# Patient Record
Sex: Female | Born: 1990 | Smoking: Never smoker
Health system: Southern US, Community
[De-identification: ages and names within clinical notes are randomized; demographics above are authoritative.]

## PROBLEM LIST (undated history)

## (undated) DIAGNOSIS — K219 Gastro-esophageal reflux disease without esophagitis: Secondary | ICD-10-CM

## (undated) DIAGNOSIS — M545 Low back pain, unspecified: Secondary | ICD-10-CM

## (undated) DIAGNOSIS — G43909 Migraine, unspecified, not intractable, without status migrainosus: Secondary | ICD-10-CM

## (undated) HISTORY — PX: ESOPHAGOGASTRODUODENOSCOPY: SHX1529

---

## 2019-02-26 ENCOUNTER — Ambulatory Visit (INDEPENDENT_AMBULATORY_CARE_PROVIDER_SITE_OTHER): Payer: 59 | Admitting: Adult Health

## 2019-02-26 ENCOUNTER — Encounter: Payer: Self-pay | Admitting: Adult Health

## 2019-02-26 ENCOUNTER — Other Ambulatory Visit: Payer: Self-pay

## 2019-02-26 VITALS — BP 122/82 | HR 107 | Temp 96.8°F | Resp 16 | Ht 62.0 in | Wt 165.2 lb

## 2019-02-26 DIAGNOSIS — Z0000 Encounter for general adult medical examination without abnormal findings: Secondary | ICD-10-CM

## 2019-02-26 DIAGNOSIS — M545 Low back pain, unspecified: Secondary | ICD-10-CM

## 2019-02-26 DIAGNOSIS — M542 Cervicalgia: Secondary | ICD-10-CM | POA: Diagnosis not present

## 2019-02-26 DIAGNOSIS — E559 Vitamin D deficiency, unspecified: Secondary | ICD-10-CM

## 2019-02-26 DIAGNOSIS — S39012A Strain of muscle, fascia and tendon of lower back, initial encounter: Secondary | ICD-10-CM | POA: Diagnosis not present

## 2019-02-26 DIAGNOSIS — Z1329 Encounter for screening for other suspected endocrine disorder: Secondary | ICD-10-CM | POA: Insufficient documentation

## 2019-02-26 DIAGNOSIS — Z13220 Encounter for screening for lipoid disorders: Secondary | ICD-10-CM | POA: Diagnosis not present

## 2019-02-26 DIAGNOSIS — H6123 Impacted cerumen, bilateral: Secondary | ICD-10-CM

## 2019-02-26 LAB — POCT URINALYSIS DIPSTICK
Bilirubin, UA: NEGATIVE
Blood, UA: NEGATIVE
Glucose, UA: NEGATIVE
Ketones, UA: NEGATIVE
Leukocytes, UA: NEGATIVE
Nitrite, UA: NEGATIVE
Protein, UA: POSITIVE — AB
Spec Grav, UA: 1.01 (ref 1.010–1.025)
Urobilinogen, UA: 0.2 E.U./dL
pH, UA: 7.5 (ref 5.0–8.0)

## 2019-02-26 MED ORDER — PREDNISONE 10 MG (21) PO TBPK: ORAL_TABLET | ORAL | 0 refills | Status: DC

## 2019-02-26 MED ORDER — CYCLOBENZAPRINE HCL 10 MG PO TABS: 10.0000 mg | ORAL_TABLET | Freq: Every day | ORAL | 0 refills | Status: DC

## 2019-02-26 NOTE — Progress Notes (Signed)
Patient: Betty Brennan, Female    DOB: [birth year], 29 y.o.   MRN: [id number] Visit Date: 02/26/2019  Today's Provider: Marcille Buffy, FNP   Chief Complaint  Patient presents with  . New Patient (Initial Visit)   Subjective:    New Patient Betty Brennan is a 29 y.o. female who presents today for health maintenance and establish patient care, patient is a former patient of Therapist, nutritional. She feels fairly well, patient reports neck on left side for two weeks and states that she has had lumbar back pain that radiates to her glute for 7 months patient. States that she has tried tylenol in the evening with no relief . She reports she is starting to become active by walking. She reports she is sleeping fairly well.   She had a normal PAP at [facility] at age 42 none since then- was normal.   Neck pain on left side of neck, she thought was dental pain, she reports teeth were fine. Onset was the beginning of January. It went away and returned. She sits at at a desk and types a lot. She notices it is worse at the end of the day. She tried ibuprofen occasionally.  She reports it went away and returned a couple of days. Denies any throat pain or difficulty swallowing. She had a globus feeling occasionally during the time she had neck pain.   She has lower back tenderness, mostly on the left. She work sitting and typing. She has been having pressure in that area. She has been trying to sit better at work.  Reports that she has no saddle paresthesias, no paresthesias or radiculopathies, she denies any loss of bowel or bladder control.  Onset of her back pain was 7 months ago, no injury or trauma she reports.  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  She did have pain radiating into her left gluteal, however this has resolved she reports.  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   Denies any recent  injuries, or trauma. Denies any previous surgeries.   She has more stress at work recently.  Denies any urinary symptoms   Patient  denies any fever, body aches,chills, rash, chest pain, shortness of breath, nausea, vomiting, or diarrhea.  Marland Kitchen  -----------------------------------------------------------------   Review of Systems  Constitutional: Negative.   HENT: Negative.   Respiratory: Negative.   Cardiovascular: Negative.   Gastrointestinal: Negative.   Genitourinary: Negative.   Musculoskeletal: Positive for back pain and neck pain.  Skin: Negative.   Neurological: Negative.   Hematological: Negative.   Psychiatric/Behavioral: Positive for sleep disturbance. Negative for agitation, behavioral problems, confusion, decreased concentration, dysphoric mood, hallucinations, self-injury and suicidal ideas. The patient is nervous/anxious. The patient is not hyperactive.        She goes to bed late.   All other systems reviewed and are negative.   Social History She  reports that she has never smoked. She has never used smokeless tobacco. She reports that she does not drink alcohol or use drugs. Social History   Socioeconomic History  . Marital status: Unknown    Spouse name: Not on file  . Number of children: Not on file  . Years of education: Not on file  . Highest education level: Not on file  Occupational History  . Not on  file  Tobacco Use  . Smoking status: Never Smoker  . Smokeless tobacco: Never Used  Substance and Sexual Activity  . Alcohol use: Never  . Drug use: Never  . Sexual activity: Not Currently  Other Topics Concern  . Not on file  Social History Narrative  . Not on file   Social Determinants of Health   Financial Resource Strain:   . Difficulty of Paying Living Expenses: Not on file  Food Insecurity:   . Worried About Programme researcher, broadcasting/film/video in the Last Year: Not on file  . Ran Out of Food in the Last Year: Not on file    Transportation Needs:   . Lack of Transportation (Medical): Not on file  . Lack of Transportation (Non-Medical): Not on file  Physical Activity:   . Days of Exercise per Week: Not on file  . Minutes of Exercise per Session: Not on file  Stress:   . Feeling of Stress : Not on file  Social Connections:   . Frequency of Communication with Friends and Family: Not on file  . Frequency of Social Gatherings with Friends and Family: Not on file  . Attends Religious Services: Not on file  . Active Member of Clubs or Organizations: Not on file  . Attends Banker Meetings: Not on file  . Marital Status: Not on file    There are no problems to display for this patient.   History reviewed. No pertinent surgical history.  Family History  Family Status  Relation Name Status  . MGM  (Not Specified)  . MGF  (Not Specified)  . PGM  (Not Specified)  . PGF  (Not Specified)   Her family history includes Alzheimer's disease in her maternal grandmother; Congestive Heart Failure in her paternal grandfather; Hypertension in her maternal grandmother; Lung cancer in her maternal grandfather; Stomach cancer in her paternal grandmother.     No Known Allergies  Previous Medications   No medications on file    Patient Care Team: Berniece Pap, FNP as PCP - General (Family Medicine)      Objective:   Vitals: BP 122/82   Pulse (!) 107   Temp (!) 96.8 F (36 C) (Oral)   Resp 16   Ht 5\' 2"  (1.575 m)   Wt 165 lb 3.2 oz (74.9 kg)   LMP 02/19/2019 (Exact Date)   SpO2 97%   BMI 30.22 kg/m    Physical Exam Vitals reviewed.  Constitutional:      General: She is not in acute distress.    Appearance: She is well-developed. She is obese. She is not ill-appearing, toxic-appearing or diaphoretic.     Interventions: She is not intubated. HENT:     Head: Normocephalic and atraumatic.     Right Ear: External ear normal. There is impacted cerumen (hard cerumen bilateral ).      Left Ear: External ear normal. There is impacted cerumen.     Nose: Nose normal.     Mouth/Throat:     Mouth: Mucous membranes are moist.     Pharynx: No oropharyngeal exudate.  Eyes:     General: Lids are normal. No scleral icterus.       Right eye: No discharge.        Left eye: No discharge.     Conjunctiva/sclera: Conjunctivae normal.     Right eye: Right conjunctiva is not injected. No exudate or hemorrhage.    Left eye: Left conjunctiva is not injected. No exudate  or hemorrhage.    Pupils: Pupils are equal, round, and reactive to light.  Neck:     Thyroid: No thyroid mass, thyromegaly or thyroid tenderness.     Vascular: Normal carotid pulses. No carotid bruit, hepatojugular reflux or JVD.     Trachea: Trachea and phonation normal. No tracheal tenderness, tracheostomy or tracheal deviation.     Meningeal: Brudzinski's sign and Kernig's sign absent.  Cardiovascular:     Rate and Rhythm: Normal rate and regular rhythm.     Pulses: Normal pulses.          Radial pulses are 2+ on the right side and 2+ on the left side.       Dorsalis pedis pulses are 2+ on the right side and 2+ on the left side.       Posterior tibial pulses are 2+ on the right side and 2+ on the left side.     Heart sounds: Normal heart sounds, S1 normal and S2 normal. Heart sounds not distant. No murmur. No friction rub. No gallop.   Pulmonary:     Effort: Pulmonary effort is normal. No tachypnea, bradypnea, accessory muscle usage or respiratory distress. She is not intubated.     Breath sounds: Normal breath sounds and air entry. No stridor, decreased air movement or transmitted upper airway sounds. No decreased breath sounds, wheezing, rhonchi or rales.  Chest:     Chest wall: No tenderness.  Abdominal:     General: Bowel sounds are normal. There is no distension or abdominal bruit.     Palpations: Abdomen is soft. There is no shifting dullness, fluid wave, hepatomegaly, splenomegaly, mass or pulsatile mass.      Tenderness: There is no abdominal tenderness. There is no right CVA tenderness, left CVA tenderness, guarding or rebound.     Hernia: No hernia is present.  Genitourinary:    Comments: deferred she may decide to go back [facility] or she will let me know if she wants to do here at next visit. She will get records of the last.  Musculoskeletal:        General: No swelling or deformity. Normal range of motion.     Right shoulder: Normal.     Left shoulder: Normal.     Right upper arm: Normal.     Left upper arm: Normal.     Right forearm: Normal.     Left forearm: Normal.     Cervical back: Normal, full passive range of motion without pain, normal range of motion and neck supple. No swelling, edema, deformity, erythema, signs of trauma, lacerations, rigidity, spasms, torticollis, tenderness, bony tenderness or crepitus. No pain with movement, spinous process tenderness or muscular tenderness. Normal range of motion.     Thoracic back: Normal.     Lumbar back: Spasms and tenderness present. No swelling, edema, deformity, signs of trauma, lacerations or bony tenderness. Normal range of motion. Negative right straight leg raise test and negative left straight leg raise test. No scoliosis.       Back:     Comments: Bilateral lumbar sacral strain/muscle tenderness, and spasm.  No visible rash or zoster.  Lymphadenopathy:     Head:     Right side of head: No submental, submandibular, tonsillar, preauricular, posterior auricular or occipital adenopathy.     Left side of head: No submental, submandibular, tonsillar, preauricular, posterior auricular or occipital adenopathy.     Cervical: No cervical adenopathy.     Right cervical: No  superficial, deep or posterior cervical adenopathy.    Left cervical: No superficial, deep or posterior cervical adenopathy.     Upper Body:     Right upper body: No supraclavicular or pectoral adenopathy.     Left upper body: No supraclavicular or pectoral  adenopathy.  Skin:    General: Skin is warm and dry.     Capillary Refill: Capillary refill takes less than 2 seconds.     Coloration: Skin is not jaundiced or pale.     Findings: No abrasion, bruising, burn, ecchymosis, erythema, lesion, petechiae or rash.     Nails: There is no clubbing.  Neurological:     General: No focal deficit present.     Mental Status: She is alert and oriented to person, place, and time. Mental status is at baseline.     GCS: GCS eye subscore is 4. GCS verbal subscore is 5. GCS motor subscore is 6.     Cranial Nerves: Cranial nerves are intact. No cranial nerve deficit or dysarthria.     Sensory: Sensation is intact. No sensory deficit.     Motor: Motor function is intact. No tremor, atrophy, abnormal muscle tone or seizure activity.     Coordination: Coordination is intact. Romberg sign negative. Coordination normal.     Gait: Gait normal.     Deep Tendon Reflexes: Reflexes are normal and symmetric. Reflexes normal. Babinski sign absent on the right side. Babinski sign absent on the left side.     Reflex Scores:      Tricep reflexes are 2+ on the right side and 2+ on the left side.      Bicep reflexes are 2+ on the right side and 2+ on the left side.      Brachioradialis reflexes are 2+ on the right side and 2+ on the left side.      Patellar reflexes are 2+ on the right side and 2+ on the left side.      Achilles reflexes are 2+ on the right side and 2+ on the left side. Psychiatric:        Attention and Perception: Attention and perception normal.        Mood and Affect: Mood and affect normal.        Speech: Speech normal.        Behavior: Behavior normal. Behavior is cooperative.        Thought Content: Thought content normal.        Cognition and Memory: Cognition and memory normal.        Judgment: Judgment normal.      Depression Screen PHQ 2/9 Scores 02/26/2019  PHQ - 2 Score 0  PHQ- 9 Score 0   No flowsheet data found.      Assessment &  Plan:     Routine Health Maintenance and Physical Exam  Exercise Activities and Dietary recommendations Goals   None      There is no immunization history on file for this patient.  Health Maintenance  Topic Date Due  . HIV Screening  03/23/2005  . TETANUS/TDAP  03/23/2009  . PAP-Cervical Cytology Screening  03/24/2011  . PAP SMEAR-Modifier  03/24/2011  . INFLUENZA VACCINE  08/23/2018     Discussed health benefits of physical activity, and encouraged her to engage in regular exercise appropriate for her age and condition.  Yearly dental exams and eye exams recommended.  Healthy lifestyle, with exercise and diet recommended.  Will return for fasting lab work.  Discussed rest ice/alternating with heating pad to neck and back.  1. Neck pain Seems to be muscular in nature, no other times, range of motion is normal, she has relief with taking warm showers, and resting.  Declines x-ray at this time, and provider does not think that is necessary for a neck x-ray unless symptoms change or worsen.  Cannot exclude an otitis externa or media in the left ear as I am unable to visualize the tympanic membrane due to hard cerumen impaction.  Advised patient that she should use Debrox for 2 weeks and then return to the office to have her ears irrigated.  This could also be a differential for her pain.  Denies any symptoms of infection such as fever chills nausea vomiting or any other systemic symptoms.  2. Vitamin D deficiency Screen for vitamin D deficiency. - VITAMIN D 25 Hydroxy (Vit-D Deficiency, Fractures)  3. 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  Do think that an x-ray of her lumbar spine would be  appropriate.  She will go to [facility] walk-in for this the order has been placed.  Will call with results once they are received. - CBC with Differential/Platelet - Comprehensive Metabolic Panel (CMET)  4. Screening for cholesterol level Will screen for cholesterol. - Lipid Panel w/o Chol/HDL Ratio  5. Screening for thyroid disorder For thyroid disease. - TSH  6. Acute bilateral low back pain, unspecified whether sciatica present Discussed red flags of back pain when to seek emergency medical treatment.  Will obtain x-ray ordered at [facility] imaging patient is aware she can walk-in Monday through Friday 8-4 30 for x-rays.  In agreement to go. Prescribed cyclobenzaprine 10 mg p.o. at hour of sleep.  She is advised that she can take this she can try half of tablet at bedtime first if she would like.  If that amount helps and she can continue a half a tablet or she can increase it to the 10 mg if needed at bedtime.  Prescribed a prednisone Dosepak in hopes that it relieve her back pain if due to inflammation, also her neck pain and discomfort.  Is aware not to take ibuprofen while taking prednisone.  She can take Tylenol for pain per bottle as needed - DG Lumbar Spine Complete; Future  7. Encounter for routine adult health examination without abnormal findings Size and diet recommended. - POCT urinalysis dipstick  8. Impacted cerumen of both ears Discussed above under neck pain, she will use Debrox for 2 weeks and return to the office for any ear irrigation for/ear recheck.  Irrigate if needed. 9. Lumbar sacral strain  She has no questions, she is verbalized understanding of red flag symptoms for back pain and neck pain, and when to seek emergency medical care.  She return to the office as directed and she will take prescriptions prescribed below as directed.  Discussed side effects of Flexeril and not to drive or operate heavy machinery or make legal decisions while taking.  She will  also take prednisone Dosepak as instructed and also reviewed side effects with her of this.  Patient verbalized understanding.  Advised patient call the office or  your primary care doctor for an appointment if no improvement within 72 hours or if any symptoms change or worsen at any time  Advised ER or urgent Care if after hours or on weekend. Call 911 for emergency symptoms at any time.Patinet verbalized understanding of all instructions given/reviewed and treatment plan and has no further questions or concerns at this time.    Return in about 2 weeks (around 03/12/2019), or if symptoms worsen or fail to improve, for at any time for any worsening symptoms, Go to Emergency room/ urgent care if worse. --------------------------------------------------------------------  Advised patient call the office or your primary care doctor for an appointment if no improvement within 72 hours or if any symptoms change or worsen at any time  Advised ER or urgent Care if after hours or on weekend. Call 911 for emergency symptoms at any time.Patinet verbalized understanding of all instructions given/reviewed and treatment plan and has no further questions or concerns at this time.     40 minutes of time was spent with patient in the room, and also 10 minutes was spent reviewing chart new patient paperwork follows through

## 2019-02-26 NOTE — Patient Instructions (Signed)
Carbamide Peroxide ear solution What is this medicine? CARBAMIDE PEROXIDE (CAR bah mide per OX ide) is used to soften and help remove ear wax. This medicine may be used for other purposes; ask your health care provider or pharmacist if you have questions. COMMON BRAND NAME(S): Auro Ear, Auro Earache Relief, Debrox, Ear Drops, Ear Wax Removal, Ear Wax Remover, Earwax Treatment, Murine, Thera-Ear What should I tell my health care provider before I take this medicine? They need to know if you have any of these conditions:  dizziness  ear discharge  ear pain, irritation or rash  infection  perforated eardrum (hole in eardrum)  an unusual or allergic reaction to carbamide peroxide, glycerin, hydrogen peroxide, other medicines, foods, dyes, or preservatives  pregnant or trying to get pregnant  breast-feeding How should I use this medicine? This medicine is only for use in the outer ear canal. Follow the directions carefully. Wash hands before and after use. The solution may be warmed by holding the bottle in the hand for 1 to 2 minutes. Lie with the affected ear facing upward. Place the proper number of drops into the ear canal. After the drops are instilled, remain lying with the affected ear upward for 5 minutes to help the drops stay in the ear canal. A cotton ball may be gently inserted at the ear opening for no longer than 5 to 10 minutes to ensure retention. Repeat, if necessary, for the opposite ear. Do not touch the tip of the dropper to the ear, fingertips, or other surface. Do not rinse the dropper after use. Keep container tightly closed. Talk to your pediatrician regarding the use of this medicine in children. While this drug may be used in children as young as 12 years for selected conditions, precautions do apply. Overdosage: If you think you have taken too much of this medicine contact a poison control center or emergency room at once. NOTE: This medicine is only for you. Do not  share this medicine with others. What if I miss a dose? If you miss a dose, use it as soon as you can. If it is almost time for your next dose, use only that dose. Do not use double or extra doses. What may interact with this medicine? Interactions are not expected. Do not use any other ear products without asking your doctor or health care professional. This list may not describe all possible interactions. Give your health care provider a list of all the medicines, herbs, non-prescription drugs, or dietary supplements you use. Also tell them if you smoke, drink alcohol, or use illegal drugs. Some items may interact with your medicine. What should I watch for while using this medicine? This medicine is not for long-term use. Do not use for more than 4 days without checking with your health care professional. Contact your doctor or health care professional if your condition does not start to get better within a few days or if you notice burning, redness, itching or swelling. What side effects may I notice from receiving this medicine? Side effects that you should report to your doctor or health care professional as soon as possible:  allergic reactions like skin rash, itching or hives, swelling of the face, lips, or tongue  burning, itching, and redness  worsening ear pain  rash Side effects that usually do not require medical attention (report to your doctor or health care professional if they continue or are bothersome):  abnormal sensation while putting the drops in the ear  temporary reduction in hearing (but not complete loss of hearing) This list may not describe all possible side effects. Call your doctor for medical advice about side effects. You may report side effects to FDA at 1-800-FDA-1088. Where should I keep my medicine? Keep out of the reach of children. Store at room temperature between 15 and 30 degrees C (59 and 86 degrees F) in a tight, light-resistant container. Keep  bottle away from excessive heat and direct sunlight. Throw away any unused medicine after the expiration date. NOTE: This sheet is a summary. It may not cover all possible information. If you have questions about this medicine, talk to your doctor, pharmacist, or health care provider.  2020 Elsevier/Gold Standard (2007-04-22 14:00:02) Musculoskeletal Pain Musculoskeletal pain refers to aches and pains in your bones, joints, muscles, and the tissues that surround them. This pain can occur in any part of the body. It can last for a short time (acute) or a long time (chronic). A physical exam, lab tests, and imaging studies may be done to find the cause of your musculoskeletal pain. Follow these instructions at home:  Lifestyle  Try to control or lower your stress levels. Stress increases muscle tension and can worsen musculoskeletal pain. It is important to recognize when you are anxious or stressed and learn ways to manage it. This may include: ? Meditation or yoga. ? Cognitive or behavioral therapy. ? Acupuncture or massage therapy.  You may continue all activities unless the activities cause more pain. When the pain gets better, slowly resume your normal activities. Gradually increase the intensity and duration of your activities or exercise. Managing pain, stiffness, and swelling  Take over-the-counter and prescription medicines only as told by your health care provider.  When your pain is severe, bed rest may be helpful. Lie or sit in any position that is comfortable, but get out of bed and walk around at least every couple of hours.  If directed, apply heat to the affected area as often as told by your health care provider. Use the heat source that your health care provider recommends, such as a moist heat pack or a heating pad. ? Place a towel between your skin and the heat source. ? Leave the heat on for 20-30 minutes. ? Remove the heat if your skin turns bright red. This is  especially important if you are unable to feel pain, heat, or cold. You may have a greater risk of getting burned.  If directed, put ice on the painful area. ? Put ice in a plastic bag. ? Place a towel between your skin and the bag. ? Leave the ice on for 20 minutes, 2-3 times a day. General instructions  Your health care provider may recommend that you see a physical therapist. This person can help you come up with a safe exercise program. Do any exercises as told by your physical therapist.  Keep all follow-up visits, including any physical therapy visits, as told by your health care providers. This is important. Contact a health care provider if:  Your pain gets worse.  Medicines do not help ease your pain.  You cannot use the part of your body that hurts, such as your arm, leg, or neck.  You have trouble sleeping.  You have trouble doing your normal activities. Get help right away if:  You have a new injury and your pain is worse or different.  You feel numb or you have tingling in the painful area. Summary  Musculoskeletal pain  refers to aches and pains in your bones, joints, muscles, and the tissues that surround them.  This pain can occur in any part of the body.  Your health care provider may recommend that you see a physical therapist. This person can help you come up with a safe exercise program. Do any exercises as told by your physical therapist.  Lower your stress level. Stress can worsen musculoskeletal pain. Ways to lower stress may include meditation, yoga, cognitive or behavioral therapy, acupuncture, and massage therapy. This information is not intended to replace advice given to you by your health care provider. Make sure you discuss any questions you have with your health care provider. Document Revised: 12/21/2016 Document Reviewed: 02/08/2016 Elsevier Patient Education  2020 Elsevier Inc. Acute Back Pain, Adult Acute back pain is sudden and usually  short-lived. It is often caused by an injury to the muscles and tissues in the back. The injury may result from:  A muscle or ligament getting overstretched or torn (strained). Ligaments are tissues that connect bones to each other. Lifting something improperly can cause a back strain.  Wear and tear (degeneration) of the spinal disks. Spinal disks are circular tissue that provides cushioning between the bones of the spine (vertebrae).  Twisting motions, such as while playing sports or doing yard work.  A hit to the back.  Arthritis. You may have a physical exam, lab tests, and imaging tests to find the cause of your pain. Acute back pain usually goes away with rest and home care. Follow these instructions at home: Managing pain, stiffness, and swelling  Take over-the-counter and prescription medicines only as told by your health care provider.  Your health care provider may recommend applying ice during the first 24-48 hours after your pain starts. To do this: ? Put ice in a plastic bag. ? Place a towel between your skin and the bag. ? Leave the ice on for 20 minutes, 2-3 times a day.  If directed, apply heat to the affected area as often as told by your health care provider. Use the heat source that your health care provider recommends, such as a moist heat pack or a heating pad. ? Place a towel between your skin and the heat source. ? Leave the heat on for 20-30 minutes. ? Remove the heat if your skin turns bright red. This is especially important if you are unable to feel pain, heat, or cold. You have a greater risk of getting burned. Activity   Do not stay in bed. Staying in bed for more than 1-2 days can delay your recovery.  Sit up and stand up straight. Avoid leaning forward when you sit, or hunching over when you stand. ? If you work at a desk, sit close to it so you do not need to lean over. Keep your chin tucked in. Keep your neck drawn back, and keep your elbows bent at a  right angle. Your arms should look like the letter "L." ? Sit high and close to the steering wheel when you drive. Add lower back (lumbar) support to your car seat, if needed.  Take short walks on even surfaces as soon as you are able. Try to increase the length of time you walk each day.  Do not sit, drive, or stand in one place for more than 30 minutes at a time. Sitting or standing for long periods of time can put stress on your back.  Do not drive or use heavy machinery while taking  prescription pain medicine.  Use proper lifting techniques. When you bend and lift, use positions that put less stress on your back: ? [place] your knees. ? Keep the load close to your body. ? Avoid twisting.  Exercise regularly as told by your health care provider. Exercising helps your back heal faster and helps prevent back injuries by keeping muscles strong and flexible.  Work with a physical therapist to make a safe exercise program, as recommended by your health care provider. Do any exercises as told by your physical therapist. Lifestyle  Maintain a healthy weight. Extra weight puts stress on your back and makes it difficult to have good posture.  Avoid activities or situations that make you feel anxious or stressed. Stress and anxiety increase muscle tension and can make back pain worse. Learn ways to manage anxiety and stress, such as through exercise. General instructions  Sleep on a firm mattress in a comfortable position. Try lying on your side with your knees slightly bent. If you lie on your back, put a pillow under your knees.  Follow your treatment plan as told by your health care provider. This may include: ? Cognitive or behavioral therapy. ? Acupuncture or massage therapy. ? Meditation or yoga. Contact a health care provider if:  You have pain that is not relieved with rest or medicine.  You have increasing pain going down into your legs or buttocks.  Your pain does not improve  after 2 weeks.  You have pain at night.  You lose weight without trying.  You have a fever or chills. Get help right away if:  You develop new bowel or bladder control problems.  You have unusual weakness or numbness in your arms or legs.  You develop nausea or vomiting.  You develop abdominal pain.  You feel faint. Summary  Acute back pain is sudden and usually short-lived.  Use proper lifting techniques. When you bend and lift, use positions that put less stress on your back.  Take over-the-counter and prescription medicines and apply heat or ice as directed by your health care provider. This information is not intended to replace advice given to you by your health care provider. Make sure you discuss any questions you have with your health care provider. Document Revised: 04/29/2018 Document Reviewed: 08/22/2016 Elsevier Patient Education  2020 [employer]. Health Maintenance, Female Adopting a healthy lifestyle and getting preventive care are important in promoting health and wellness. Ask your health care provider about:  The right schedule for you to have regular tests and exams.  Things you can do on your own to prevent diseases and keep yourself healthy. What should I know about diet, weight, and exercise? Eat a healthy diet   Eat a diet that includes plenty of vegetables, fruits, low-fat dairy products, and lean protein.  Do not eat a lot of foods that are high in solid fats, added sugars, or sodium. Maintain a healthy weight Body mass index (BMI) is used to identify weight problems. It estimates body fat based on height and weight. Your health care provider can help determine your BMI and help you achieve or maintain a healthy weight. Get regular exercise Get regular exercise. This is one of the most important things you can do for your health. Most adults should:  Exercise for at least 150 minutes each week. The exercise should increase your heart rate and  make you sweat (moderate-intensity exercise).  Do strengthening exercises at least twice a week. This is in addition to  the moderate-intensity exercise.  Spend less time sitting. Even light physical activity can be beneficial. Watch cholesterol and blood lipids Have your blood tested for lipids and cholesterol at 29 years of age, then have this test every 5 years. Have your cholesterol levels checked more often if:  Your lipid or cholesterol levels are high.  You are older than 29 years of age.  You are at high risk for heart disease. What should I know about cancer screening? Depending on your health history and family history, you may need to have cancer screening at various ages. This may include screening for:  Breast cancer.  Cervical cancer.  Colorectal cancer.  Skin cancer.  Lung cancer. What should I know about heart disease, diabetes, and high blood pressure? Blood pressure and heart disease  High blood pressure causes heart disease and increases the risk of stroke. This is more likely to develop in people who have high blood pressure readings, are of African descent, or are overweight.  Have your blood pressure checked: ? Every 3-5 years if you are 76-55 years of age. ? Every year if you are 48 years old or older. Diabetes Have regular diabetes screenings. This checks your fasting blood sugar level. Have the screening done:  Once every three years after age 63 if you are at a normal weight and have a low risk for diabetes.  More often and at a younger age if you are overweight or have a high risk for diabetes. What should I know about preventing infection? Hepatitis B If you have a higher risk for hepatitis B, you should be screened for this virus. Talk with your health care provider to find out if you are at risk for hepatitis B infection. Hepatitis C Testing is recommended for:  Everyone born from [birth year] through 1965.  Anyone with known risk factors for  hepatitis C. Sexually transmitted infections (STIs)  Get screened for STIs, including gonorrhea and chlamydia, if: ? You are sexually active and are younger than 29 years of age. ? You are older than 29 years of age and your health care provider tells you that you are at risk for this type of infection. ? Your sexual activity has changed since you were last screened, and you are at increased risk for chlamydia or gonorrhea. Ask your health care provider if you are at risk.  Ask your health care provider about whether you are at high risk for HIV. Your health care provider may recommend a prescription medicine to help prevent HIV infection. If you choose to take medicine to prevent HIV, you should first get tested for HIV. You should then be tested every 3 months for as long as you are taking the medicine. Pregnancy  If you are about to stop having your period (premenopausal) and you may become pregnant, seek counseling before you get pregnant.  Take 400 to 800 micrograms (mcg) of folic acid every day if you become pregnant.  Ask for birth control (contraception) if you want to prevent pregnancy. Osteoporosis and menopause Osteoporosis is a disease in which the bones lose minerals and strength with aging. This can result in bone fractures. If you are 58 years old or older, or if you are at risk for osteoporosis and fractures, ask your health care provider if you should:  Be screened for bone loss.  Take a calcium or vitamin D supplement to lower your risk of fractures.  Be given hormone replacement therapy (HRT) to treat symptoms of menopause. Follow  these instructions at home: Lifestyle  Do not use any products that contain nicotine or tobacco, such as cigarettes, e-cigarettes, and chewing tobacco. If you need help quitting, ask your health care provider.  Do not use street drugs.  Do not share needles.  Ask your health care provider for help if you need support or information about  quitting drugs. Alcohol use  Do not drink alcohol if: ? Your health care provider tells you not to drink. ? You are pregnant, may be pregnant, or are planning to become pregnant.  If you drink alcohol: ? Limit how much you use to 0-1 drink a day. ? Limit intake if you are breastfeeding.  Be aware of how much alcohol is in your drink. In the [place] one drink equals one 12 oz bottle of beer (355 mL), one 5 oz glass of wine (148 mL), or one 1 oz glass of hard liquor (44 mL). General instructions  Schedule regular health, dental, and eye exams.  Stay current with your vaccines.  Tell your health care provider if: ? You often feel depressed. ? You have ever been abused or do not feel safe at home. Summary  Adopting a healthy lifestyle and getting preventive care are important in promoting health and wellness.  Follow your health care provider's instructions about healthy diet, exercising, and getting tested or screened for diseases.  Follow your health care provider's instructions on monitoring your cholesterol and blood pressure. This information is not intended to replace advice given to you by your health care provider. Make sure you discuss any questions you have with your health care provider. Document Revised: 01/01/2018 Document Reviewed: 01/01/2018 Elsevier Patient Education  2020 [employer].

## 2019-02-28 LAB — CBC WITH DIFFERENTIAL/PLATELET
Basophils Absolute: 0 10*3/uL (ref 0.0–0.2)
Basos: 0 %
EOS (ABSOLUTE): 0.1 10*3/uL (ref 0.0–0.4)
Eos: 1 %
Hematocrit: 42.5 % (ref 34.0–46.6)
Hemoglobin: 14.1 g/dL (ref 11.1–15.9)
Immature Grans (Abs): 0 10*3/uL (ref 0.0–0.1)
Immature Granulocytes: 0 %
Lymphocytes Absolute: 2.3 10*3/uL (ref 0.7–3.1)
Lymphs: 33 %
MCH: 28.6 pg (ref 26.6–33.0)
MCHC: 33.2 g/dL (ref 31.5–35.7)
MCV: 86 fL (ref 79–97)
Monocytes Absolute: 0.7 10*3/uL (ref 0.1–0.9)
Monocytes: 10 %
Neutrophils Absolute: 3.8 10*3/uL (ref 1.4–7.0)
Neutrophils: 56 %
Platelets: 368 10*3/uL (ref 150–450)
RBC: 4.93 x10E6/uL (ref 3.77–5.28)
RDW: 13.1 % (ref 11.7–15.4)
WBC: 6.9 10*3/uL (ref 3.4–10.8)

## 2019-02-28 LAB — COMPREHENSIVE METABOLIC PANEL
ALT: 17 IU/L (ref 0–32)
AST: 21 IU/L (ref 0–40)
Albumin/Globulin Ratio: 1.7 (ref 1.2–2.2)
Albumin: 4.7 g/dL (ref 3.9–5.0)
Alkaline Phosphatase: 84 IU/L (ref 39–117)
BUN/Creatinine Ratio: 13 (ref 9–23)
BUN: 9 mg/dL (ref 6–20)
Bilirubin Total: 0.3 mg/dL (ref 0.0–1.2)
CO2: 23 mmol/L (ref 20–29)
Calcium: 9.4 mg/dL (ref 8.7–10.2)
Chloride: 102 mmol/L (ref 96–106)
Creatinine, Ser: 0.7 mg/dL (ref 0.57–1.00)
GFR calc Af Amer: 136 mL/min/{1.73_m2} (ref >59)
GFR calc non Af Amer: 118 mL/min/{1.73_m2} (ref >59)
Globulin, Total: 2.7 g/dL (ref 1.5–4.5)
Glucose: 88 mg/dL (ref 65–99)
Potassium: 4.4 mmol/L (ref 3.5–5.2)
Sodium: 140 mmol/L (ref 134–144)
Total Protein: 7.4 g/dL (ref 6.0–8.5)

## 2019-02-28 LAB — TSH: TSH: 1.69 u[IU]/mL (ref 0.450–4.500)

## 2019-02-28 LAB — LIPID PANEL W/O CHOL/HDL RATIO
Cholesterol, Total: 201 mg/dL — ABNORMAL HIGH (ref 100–199)
HDL: 53 mg/dL (ref >39)
LDL Chol Calc (NIH): 130 mg/dL — ABNORMAL HIGH (ref 0–99)
Triglycerides: 98 mg/dL (ref 0–149)
VLDL Cholesterol Cal: 18 mg/dL (ref 5–40)

## 2019-02-28 LAB — VITAMIN D 25 HYDROXY (VIT D DEFICIENCY, FRACTURES): Vit D, 25-Hydroxy: 25.9 ng/mL — ABNORMAL LOW (ref 30.0–100.0)

## 2019-03-02 ENCOUNTER — Ambulatory Visit
Admission: RE | Admit: 2019-03-02 | Discharge: 2019-03-02 | Disposition: A | Discharge status: Home / Self Care | Payer: 59 | Source: Ambulatory Visit | Attending: Adult Health | Admitting: Adult Health

## 2019-03-02 ENCOUNTER — Other Ambulatory Visit: Payer: Self-pay

## 2019-03-02 DIAGNOSIS — M545 Low back pain, unspecified: Secondary | ICD-10-CM

## 2019-03-02 NOTE — Progress Notes (Signed)
CBC normal no anemia or signs of infection.  CMP normal kidney and liver function.  Thyroid lab within normal limits.  Total cholesterol and LDL elevated.  Discuss lifestyle modification with patient e.g. increase exercise, fiber, fruits, vegetables, lean meat, and omega 3/fish intake and decrease saturated fat.  If patient following strict diet and exercise program already please schedule follow up appointment with primary care physician.  Recheck cholesterol in 6 months. Keep plan from last office visit.

## 2019-03-03 ENCOUNTER — Telehealth: Payer: Self-pay

## 2019-03-03 DIAGNOSIS — M545 Low back pain, unspecified: Secondary | ICD-10-CM

## 2019-03-03 DIAGNOSIS — M542 Cervicalgia: Secondary | ICD-10-CM

## 2019-03-03 DIAGNOSIS — R937 Abnormal findings on diagnostic imaging of other parts of musculoskeletal system: Secondary | ICD-10-CM

## 2019-03-03 NOTE — Telephone Encounter (Signed)
Pt given result per Marvell Fuller; she verbalized understanding; the pt would like to be seen in [facility] area; will route to office for notification.

## 2019-03-03 NOTE — Progress Notes (Signed)
Mild scoliosis of lumbar spine that may be due to position per radiologist. She also has mild joint slipping at L2-L3. Mild to moderate disc narrowing at L5-S1. 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 Let me know if she has a preference on area/ practice/ doctor to refer to.   Also she can walk in at [facility] Monday to Friday 1pm-7pm if any symptoms worsening.  Just review Red Flags for back pain including any of loss of bowel or bladder control, numbness in the groin would need to be seen in ED immediately.

## 2019-03-03 NOTE — Telephone Encounter (Signed)
-----   Message from Berniece Pap, FNP sent at 03/03/2019  9:04 AM EST ----- Mild scoliosis of lumbar spine that may be due to position per radiologist. She also has mild joint slipping at L2-L3. Mild to moderate disc narrowing at L5-S1. 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 Let me know if she has a preference on area/ practice/ doctor to refer to.   Also she can walk in at [facility] Monday to Friday 1pm-7pm if any symptoms worsening.  Just review Red Flags for back pain including any of loss of bowel or bladder control, numbness in the groin would need to be seen in ED immediately.

## 2019-03-03 NOTE — Telephone Encounter (Signed)
LMTCB, PEC may give results. 

## 2019-03-03 NOTE — Telephone Encounter (Signed)
Please advise 

## 2019-03-04 DIAGNOSIS — R937 Abnormal findings on diagnostic imaging of other parts of musculoskeletal system: Secondary | ICD-10-CM | POA: Insufficient documentation

## 2019-03-04 NOTE — Telephone Encounter (Signed)
Orders Placed This Encounter  Procedures  . Ambulatory referral to Orthopedic Surgery   

## 2019-03-04 NOTE — Addendum Note (Signed)
Addended by: Berniece Pap on: 03/04/2019 09:07 AM   Modules accepted: Orders

## 2019-03-04 NOTE — Telephone Encounter (Signed)
Referral placed to [facility] [place].  She should hear within 1-2 weeks if not give office a call.  Advise patient call the office or your primary care doctor for an appointment if no improvement within 72 hours or if any symptoms change or worsen at any time  Advised ER or urgent Care if after hours or on weekend. Call 911 for emergency symptoms at any time.Patinet verbalized understanding of all instructions given/reviewed and treatment plan and has no further questions or concerns at this time.

## 2019-03-05 ENCOUNTER — Telehealth: Payer: Self-pay | Admitting: Adult Health

## 2019-03-05 NOTE — Telephone Encounter (Signed)
Pt came in for back problems last week / Pt called and is now having tingles in her legs/ Pt stated the Prednisone helped at first but now that she is almost finished it she is having the tingling sensation / please advise /Pt would like to know what she needs to do / she was advised to call back if symptoms worsen

## 2019-03-05 NOTE — Telephone Encounter (Signed)
She can return for an evaluation in office if any symptoms have changed since I seen her. If any groin numbness, loss of bowel/ bladder control Emergency room. I can order her prednisone dose pack once more. She should be hearing from referral to [facility] within the week.- due to abnormal x ray as has been relayed to patient. The other option is she can go ahead and go to [facility] walk in clinic this afternoon open Monday to Friday 1pm to 7pm.

## 2019-03-05 NOTE — Telephone Encounter (Signed)
Patient has been advised. KW 

## 2019-03-05 NOTE — Telephone Encounter (Signed)
Patient was advised and states that she will follow with [facility]. KW

## 2019-03-09 ENCOUNTER — Encounter: Payer: Self-pay | Admitting: Adult Health

## 2019-03-09 ENCOUNTER — Other Ambulatory Visit: Payer: Self-pay

## 2019-03-09 ENCOUNTER — Ambulatory Visit (INDEPENDENT_AMBULATORY_CARE_PROVIDER_SITE_OTHER): Payer: 59 | Admitting: Adult Health

## 2019-03-09 VITALS — BP 112/84 | HR 88 | Temp 97.8°F | Resp 16 | Wt 165.6 lb

## 2019-03-09 DIAGNOSIS — H6982 Other specified disorders of Eustachian tube, left ear: Secondary | ICD-10-CM

## 2019-03-09 DIAGNOSIS — H6502 Acute serous otitis media, left ear: Secondary | ICD-10-CM | POA: Diagnosis not present

## 2019-03-09 DIAGNOSIS — L989 Disorder of the skin and subcutaneous tissue, unspecified: Secondary | ICD-10-CM

## 2019-03-09 MED ORDER — FLUTICASONE PROPIONATE 50 MCG/ACT NA SUSP: 2.0000 | Freq: Every day | NASAL | 1 refills | Status: DC

## 2019-03-09 MED ORDER — AMOXICILLIN 875 MG PO TABS: 875.0000 mg | ORAL_TABLET | Freq: Two times a day (BID) | ORAL | 0 refills | Status: DC

## 2019-03-09 NOTE — Progress Notes (Signed)
Patient: Betty Brennan Female    DOB: [birth year]   28 y.o.   MRN: [id number] Visit Date: 03/09/2019  Today's Provider: Jairo Ben, FNP   Chief Complaint  Patient presents with  . Cerumen Impaction   Subjective:     Ear Fullness  There is pain in both ears. This is a recurrent problem. The current episode started 1 to 4 weeks ago. The problem has been unchanged. There has been no fever. Associated symptoms include ear discharge, hearing loss and neck pain. Pertinent negatives include no abdominal pain, coughing, diarrhea, headaches, rash, rhinorrhea, sore throat or vomiting. She has tried ear drops for the symptoms. The treatment provided no relief.   She saw [facility] - last week. He gave Meloxicam 15mg  . She is doing iced and heat. She has physical therapy ordered by orthopedics.   Lumbar x ray is on file. She has no new concerns, denies any groin numbness or any loss of bowel or bladder control. She will keep the follow up's with [facility] and report new or changing concerns.   She is due for PAP smear, declines today. She will schedule.   Patient's last menstrual period was 02/19/2019 (exact date).    No Known Allergies   Current Outpatient Medications:  .  meloxicam (MOBIC) 15 MG tablet, Take 15 mg by mouth daily., Disp: , Rfl:   Review of Systems  Constitutional: Negative.   HENT: Positive for ear discharge and hearing loss. Negative for congestion, dental problem, drooling, ear pain, facial swelling, mouth sores, nosebleeds, postnasal drip, rhinorrhea, sinus pressure, sinus pain, sneezing, sore throat, tinnitus, trouble swallowing and voice change.   Respiratory: Negative.  Negative for cough.   Cardiovascular: Negative.   Gastrointestinal: Negative.  Negative for abdominal pain, diarrhea and vomiting.  Genitourinary: Negative.   Musculoskeletal: Positive for arthralgias, back pain and neck pain. Negative for gait problem, joint  swelling, myalgias and neck stiffness.  Skin: Negative for rash.       Enlarging skin lesion on chin - reports present x 1 year.   Neurological: Negative.  Negative for headaches.  Psychiatric/Behavioral: Negative.     Social History   Tobacco Use  . Smoking status: Never Smoker  . Smokeless tobacco: Never Used  Substance Use Topics  . Alcohol use: Never      Objective:  Blood pressure 112/84, pulse 88, temperature 97.8 F (36.6 C), temperature source Temporal, resp. rate 16, weight 165 lb 9.6 oz (75.1 kg), last menstrual period 02/19/2019, SpO2 97 %.   Physical Exam Vitals reviewed.  Constitutional:      General: She is not in acute distress.    Appearance: Normal appearance. She is well-developed. She is not ill-appearing, toxic-appearing or diaphoretic.  HENT:     Head: Normocephalic and atraumatic.     Jaw: There is normal jaw occlusion.     Salivary Glands: Right salivary gland is not diffusely enlarged or tender. Left salivary gland is not diffusely enlarged or tender.     Right Ear: Hearing, ear canal and external ear normal. A middle ear effusion is present. Tympanic membrane is not perforated, erythematous or bulging.     Left Ear: Hearing, ear canal and external ear normal. A middle ear effusion is present. Tympanic membrane is erythematous (brown/ yellow fluid behind tympanic membrane left/ erythematous membrane  - mild bulging. ) and bulging. Tympanic membrane is not perforated.     Nose: Mucosal edema present. No congestion or rhinorrhea.  Right Sinus: Maxillary sinus tenderness present. No frontal sinus tenderness.     Left Sinus: No maxillary sinus tenderness or frontal sinus tenderness.     Mouth/Throat:     Mouth: Mucous membranes are moist.     Pharynx: Uvula midline. No oropharyngeal exudate or posterior oropharyngeal erythema.     Tonsils: No tonsillar exudate or tonsillar abscesses. 2+ on the right. 0 on the left.  Eyes:     General: Lids are normal.  No scleral icterus.       Right eye: No discharge.        Left eye: No discharge.     Conjunctiva/sclera: Conjunctivae normal.     Pupils: Pupils are equal, round, and reactive to light.  Neck:     Vascular: No carotid bruit or JVD.     Trachea: Trachea normal. No tracheal deviation.     Meningeal: Brudzinski's sign absent.  Cardiovascular:     Rate and Rhythm: Normal rate and regular rhythm.     Heart sounds: Normal heart sounds. No murmur. No friction rub. No gallop.   Pulmonary:     Effort: Pulmonary effort is normal. No respiratory distress.     Breath sounds: Normal breath sounds. No stridor. No wheezing or rales.  Chest:     Chest wall: No tenderness.  Abdominal:     General: Bowel sounds are normal.     Palpations: Abdomen is soft.  Musculoskeletal:        General: Normal range of motion.     Cervical back: Full passive range of motion without pain, normal range of motion and neck supple. No rigidity or tenderness.  Lymphadenopathy:     Head:     Right side of head: No submental, submandibular, tonsillar, preauricular, posterior auricular or occipital adenopathy.     Left side of head: No submental, submandibular, tonsillar, preauricular, posterior auricular or occipital adenopathy.     Cervical: Cervical adenopathy present.  Skin:    General: Skin is warm and dry.     Capillary Refill: Capillary refill takes less than 2 seconds.     Coloration: Skin is not pale.     Findings: Lesion present. No erythema or rash. Rash is not crusting or vesicular.          Comments: Tiny rough skin colored lesion with appearance of  wart like structure on chin, no drainage or blister. Question Molluscum contagiosum versus wart,   Neurological:     Mental Status: She is alert and oriented to person, place, and time.  Psychiatric:        Speech: Speech normal.        Behavior: Behavior normal.        Thought Content: Thought content normal.        Judgment: Judgment normal.      No  results found for any visits on 03/09/19.     Assessment & Plan    1. Non-recurrent acute serous otitis media of left ear Meds ordered this encounter  Medications  . amoxicillin (AMOXIL) 875 MG tablet    Sig: Take 1 tablet (875 mg total) by mouth 2 (two) times daily.    Dispense:  20 tablet    Refill:  0  . fluticasone (FLONASE) 50 MCG/ACT nasal spray    Sig: Place 2 sprays into both nostrils daily.    Dispense:  16 g    Refill:  1     2. Lesion of skin of face  - Ambulatory  referral to Dermatology  3. Eustachian tube dysfunction, left Flonase as directed above     Return in about 2 weeks (around 03/23/2019), or if symptoms worsen or fail to improve, for at any time for any worsening symptoms, Go to Emergency room/ urgent care if worse.  Advised patient call the office or your primary care doctor for an appointment if no improvement within 72 hours or if any symptoms change or worsen at any time  Advised ER or urgent Care if after hours or on weekend. Call 911 for emergency symptoms at any time.Patinet verbalized understanding of all instructions given/reviewed and treatment plan and has no further questions or concerns at this time.      The entirety of the information documented in the History of Present Illness, Review of Systems and Physical Exam were personally obtained by me. Portions of this information were initially documented by the  Certified Medical Assistant whose name is documented in Epic and reviewed by me for thoroughness and accuracy.  I have personally performed the exam and reviewed the chart and it is accurate to the best of my knowledge.  Museum/gallery conservator has been used and any errors in dictation or transcription are unintentional.  Eula Fried. Flinchum FNP-C  [facility]   Jairo Ben, FNP  [facility]

## 2019-03-09 NOTE — Patient Instructions (Addendum)
Follow up for your due PAP smear- pelvic exam at your convenience, if you prefer gynecologist we can refer you for that as well. Just let us know.   Otitis Media, Adult  Otitis media means that the middle ear is red and swollen (inflamed) and full of fluid. The condition usually goes away on its own. Follow these instructions at home:  Take over-the-counter and prescription medicines only as told by your doctor.  If you were prescribed an antibiotic medicine, take it as told by your doctor. Do not stop taking the antibiotic even if you start to feel better.  Keep all follow-up visits as told by your doctor. This is important. Contact a doctor if:  You have bleeding from your nose.  There is a lump on your neck.  You are not getting better in 5 days.  You feel worse instead of better. Get help right away if:  You have pain that is not helped with medicine.  You have swelling, redness, or pain around your ear.  You get a stiff neck.  You cannot move part of your face (paralyzed).  You notice that the bone behind your ear hurts when you touch it.  You get a very bad headache. Summary  Otitis media means that the middle ear is red, swollen, and full of fluid.  This condition usually goes away on its own. In some cases, treatment may be needed.  If you were prescribed an antibiotic medicine, take it as told by your doctor. This information is not intended to replace advice given to you by your health care provider. Make sure you discuss any questions you have with your health care provider. Document Revised: 12/21/2016 Document Reviewed: 01/30/2016 Elsevier Patient Education  2020 Else[employer]. Amoxicillin capsules or tablets What is this medicine? AMOXICILLIN (a mox i SIL in) is a penicillin antibiotic. It is used to treat certain kinds of bacterial infections. It will not work for colds, flu, or other viral infections. This medicine may be used for other purposes; ask  your health care provider or pharmacist if you have questions. COMMON BRAND NAME(S): Amoxil, Moxilin, Sumox, Trimox What should I tell my health care provider before I take this medicine? They need to know if you have any of these conditions:  kidney disease  an unusual or allergic reaction to amoxicillin, other penicillins, cephalosporin antibiotics, other medicines, foods, dyes, or preservatives  pregnant or trying to get pregnant  breast-feeding How should I use this medicine? Take this medicine by mouth with a glass of water. Follow the directions on your prescription label. You can take it with or without food. If it upsets your stomach, take it with food. Take your medicine at regular intervals. Do not take your medicine more often than directed. Take all of your medicine as directed even if you think you are better. Do not skip doses or stop your medicine early. Talk to your pediatrician regarding the use of this medicine in children. While this drug may be prescribed for selected conditions, precautions do apply. Overdosage: If you think you have taken too much of this medicine contact a poison control center or emergency room at once. NOTE: This medicine is only for you. Do not share this medicine with others. What if I miss a dose? If you miss a dose, take it as soon as you can. If it is almost time for your next dose, take only that dose. Do not take double or extra doses. What may interact  with this medicine?  allopurinol  birth control pills  certain antibiotics like chloramphenicol, erythromycin, sulfamethoxazole, tetracycline  certain medicines that treat or prevent blood clots like warfarin This list may not describe all possible interactions. Give your health care provider a list of all the medicines, herbs, non-prescription drugs, or dietary supplements you use. Also tell them if you smoke, drink alcohol, or use illegal drugs. Some items may interact with your  medicine. What should I watch for while using this medicine? Tell your health care professional if your symptoms do not start to get better or if they get worse. Do not treat diarrhea with over the counter products. Contact your health care professional if you have diarrhea that lasts more than 2 days or if it is severe and watery. If you have diabetes, you may get a false-positive result for sugar in your urine. Check with your health care professional. Birth control may not work properly while you are taking this medicine. Talk to your health care professional about using an extra method of birth control. This medicine may cause serious skin reactions. They can happen weeks to months after starting the medicine. Contact your health care provider right away if you notice fevers or flu-like symptoms with a rash. The rash may be red or purple and then turn into blisters or peeling of the skin. Or, you might notice a red rash with swelling of the face, lips or lymph nodes in your neck or under your arms. What side effects may I notice from receiving this medicine? Side effects that you should report to your doctor or health care professional as soon as possible:  allergic reactions like skin rash, itching or hives, swelling of the face, lips, or tongue  bloody or watery diarrhea  breathing problems  feeling faint; lightheaded, falls  fever  redness, blistering, peeling or loosening of the skin, including inside the mouth  seizures  signs and symptoms of kidney injury like trouble passing urine or change in the amount of urine  signs and symptoms of liver injury like dark yellow or brown urine; general ill feeling or flu-like symptoms; light-colored stools; loss of appetite; nausea; right upper belly pain; unusually weak or tired; yellowing of the eyes or skin  unusual bleeding or bruising  unusually weak or tired Side effects that usually do not require medical attention (report to your  doctor or health care professional if they continue or are bothersome):  anxious  confusion  diarrhea  dizziness  headache  nausea, vomiting  stomach upset  trouble sleeping This list may not describe all possible side effects. Call your doctor for medical advice about side effects. You may report side effects to FDA at 1-800-FDA-1088. Where should I keep my medicine? Keep out of the reach of children. Store at room temperature between 20 and 25 degrees C (68 and 77 degrees F). Throw away any unused medicine after the expiration date. NOTE: This sheet is a summary. It may not cover all possible information. If you have questions about this medicine, talk to your doctor, pharmacist, or health care provider.  2020 Elsevier/Gold Standard (2018-03-21 14:32:29)  Earwax Buildup, Adult The ears produce a substance called earwax that helps keep bacteria out of the ear and protects the skin in the ear canal. Occasionally, earwax can build up in the ear and cause discomfort or hearing loss. What increases the risk? This condition is more likely to develop in people who:  Are female.  Are elderly.  Naturally  produce more earwax.  Clean their ears often with cotton swabs.  Use earplugs often.  Use in-ear headphones often.  Wear hearing aids.  Have narrow ear canals.  Have earwax that is overly thick or sticky.  Have eczema.  Are dehydrated.  Have excess hair in the ear canal. What are the signs or symptoms? Symptoms of this condition include:  Reduced or muffled hearing.  A feeling of fullness in the ear or feeling that the ear is plugged.  Fluid coming from the ear.  Ear pain.  Ear itch.  Ringing in the ear.  Coughing.  An obvious piece of earwax that can be seen inside the ear canal. How is this diagnosed? This condition may be diagnosed based on:  Your symptoms.  Your medical history.  An ear exam. During the exam, your health care provider will look  into your ear with an instrument called an otoscope. You may have tests, including a hearing test. How is this treated? This condition may be treated by:  Using ear drops to soften the earwax.  Having the earwax removed by a health care provider. The health care provider may: ? Flush the ear with water. ? Use an instrument that has a loop on the end (curette). ? Use a suction device.  Surgery to remove the wax buildup. This may be done in severe cases. Follow these instructions at home:   Take over-the-counter and prescription medicines only as told by your health care provider.  Do not put any objects, including cotton swabs, into your ear. You can clean the opening of your ear canal with a washcloth or facial tissue.  Follow instructions from your health care provider about cleaning your ears. Do not over-clean your ears.  Drink enough fluid to keep your urine clear or pale yellow. This will help to thin the earwax.  Keep all follow-up visits as told by your health care provider. If earwax builds up in your ears often or if you use hearing aids, consider seeing your health care provider for routine, preventive ear cleanings. Ask your health care provider how often you should schedule your cleanings.  If you have hearing aids, clean them according to instructions from the manufacturer and your health care provider. Contact a health care provider if:  You have ear pain.  You develop a fever.  You have blood, pus, or other fluid coming from your ear.  You have hearing loss.  You have ringing in your ears that does not go away.  Your symptoms do not improve with treatment.  You feel like the room is spinning (vertigo). Summary  Earwax can build up in the ear and cause discomfort or hearing loss.  The most common symptoms of this condition include reduced or muffled hearing and a feeling of fullness in the ear or feeling that the ear is plugged.  This condition may be  diagnosed based on your symptoms, your medical history, and an ear exam.  This condition may be treated by using ear drops to soften the earwax or by having the earwax removed by a health care provider.  Do not put any objects, including cotton swabs, into your ear. You can clean the opening of your ear canal with a washcloth or facial tissue.   This information is not intended to replace advice given to you by your health care provider. Make sure you discuss any questions you have with your health care provider. Document Revised: 12/21/2016 Document Reviewed: 03/21/2016 Elsevier Patient  Education  2020 Elsevier Inc.  

## 2019-03-12 ENCOUNTER — Ambulatory Visit: Payer: Self-pay | Admitting: Adult Health

## 2019-03-19 ENCOUNTER — Ambulatory Visit: Payer: Self-pay | Admitting: Adult Health

## 2019-03-30 ENCOUNTER — Other Ambulatory Visit: Payer: Self-pay

## 2019-03-30 ENCOUNTER — Encounter: Payer: Self-pay | Admitting: Adult Health

## 2019-03-30 ENCOUNTER — Ambulatory Visit (INDEPENDENT_AMBULATORY_CARE_PROVIDER_SITE_OTHER): Payer: 59 | Admitting: Adult Health

## 2019-03-30 VITALS — BP 124/84 | HR 76 | Temp 96.6°F | Resp 16 | Wt 164.4 lb

## 2019-03-30 DIAGNOSIS — M5441 Lumbago with sciatica, right side: Secondary | ICD-10-CM | POA: Diagnosis not present

## 2019-03-30 DIAGNOSIS — Z6830 Body mass index (BMI) 30.0-30.9, adult: Secondary | ICD-10-CM

## 2019-03-30 DIAGNOSIS — M542 Cervicalgia: Secondary | ICD-10-CM | POA: Diagnosis not present

## 2019-03-30 DIAGNOSIS — H6982 Other specified disorders of Eustachian tube, left ear: Secondary | ICD-10-CM | POA: Diagnosis not present

## 2019-03-30 MED ORDER — CETIRIZINE HCL 10 MG PO TABS: 10.0000 mg | ORAL_TABLET | Freq: Every day | ORAL | 0 refills | Status: AC

## 2019-03-30 MED ORDER — AZELASTINE HCL 0.1 % NA SOLN: 2.0000 | Freq: Two times a day (BID) | NASAL | 2 refills | Status: DC

## 2019-03-30 NOTE — Patient Instructions (Addendum)
Eustachian Tube Dysfunction  Eustachian tube dysfunction refers to a condition in which a blockage develops in the narrow passage that connects the middle ear to the back of the nose (eustachian tube). The eustachian tube regulates air pressure in the middle ear by letting air move between the ear and nose. It also helps to drain fluid from the middle ear space. Eustachian tube dysfunction can affect one or both ears. When the eustachian tube does not function properly, air pressure, fluid, or both can build up in the middle ear. What are the causes? This condition occurs when the eustachian tube becomes blocked or cannot open normally. Common causes of this condition include:  Ear infections.  Colds and other infections that affect the nose, mouth, and throat (upper respiratory tract).  Allergies.  Irritation from cigarette smoke.  Irritation from stomach acid coming up into the esophagus (gastroesophageal reflux). The esophagus is the tube that carries food from the mouth to the stomach.  Sudden changes in air pressure, such as from descending in an airplane or scuba diving.  Abnormal growths in the nose or throat, such as: ? Growths that line the nose (nasal polyps). ? Abnormal growth of cells (tumors). ? Enlarged tissue at the back of the throat (adenoids). What increases the risk? You are more likely to develop this condition if:  You smoke.  You are overweight.  You are a child who has: ? Certain birth defects of the mouth, such as cleft palate. ? Large tonsils or adenoids. What are the signs or symptoms? Common symptoms of this condition include:  A feeling of fullness in the ear.  Ear pain.  Clicking or popping noises in the ear.  Ringing in the ear.  Hearing loss.  Loss of balance.  Dizziness. Symptoms may get worse when the air pressure around you changes, such as when you travel to an area of high elevation, fly on an airplane, or go scuba diving. How is  this diagnosed? This condition may be diagnosed based on:  Your symptoms.  A physical exam of your ears, nose, and throat.  Tests, such as those that measure: ? The movement of your eardrum (tympanogram). ? Your hearing (audiometry). How is this treated? Treatment depends on the cause and severity of your condition.  In mild cases, you may relieve your symptoms by moving air into your ears. This is called "popping the ears."  In more severe cases, or if you have symptoms of fluid in your ears, treatment may include: ? Medicines to relieve congestion (decongestants). ? Medicines that treat allergies (antihistamines). ? Nasal sprays or ear drops that contain medicines that reduce swelling (steroids). ? A procedure to drain the fluid in your eardrum (myringotomy). In this procedure, a small tube is placed in the eardrum to:  Drain the fluid.  Restore the air in the middle ear space. ? A procedure to insert a balloon device through the nose to inflate the opening of the eustachian tube (balloon dilation). Follow these instructions at home: Lifestyle  Do not do any of the following until your health care provider approves: ? Travel to high altitudes. ? Fly in airplanes. ? Work in a pressurized cabin or room. ? Scuba dive.  Do not use any products that contain nicotine or tobacco, such as cigarettes and e-cigarettes. If you need help quitting, ask your health care provider.  Keep your ears dry. Wear fitted earplugs during showering and bathing. Dry your ears completely after. General instructions  Take over-the-counter   and prescription medicines only as told by your health care provider.  Use techniques to help pop your ears as recommended by your health care provider. These may include: ? Chewing gum. ? Yawning. ? Frequent, forceful swallowing. ? Closing your mouth, holding your nose closed, and gently blowing as if you are trying to blow air out of your nose.  Keep all  follow-up visits as told by your health care provider. This is important. Contact a health care provider if:  Your symptoms do not go away after treatment.  Your symptoms come back after treatment.  You are unable to pop your ears.  You have: ? A fever. ? Pain in your ear. ? Pain in your head or neck. ? Fluid draining from your ear.  Your hearing suddenly changes.  You become very dizzy.  You lose your balance. Summary  Eustachian tube dysfunction refers to a condition in which a blockage develops in the eustachian tube.  It can be caused by ear infections, allergies, inhaled irritants, or abnormal growths in the nose or throat.  Symptoms include ear pain, hearing loss, or ringing in the ears.  Mild cases are treated with maneuvers to unblock the ears, such as yawning or ear popping.  Severe cases are treated with medicines. Surgery may also be done (rare). This information is not intended to replace advice given to you by your health care provider. Make sure you discuss any questions you have with your health care provider. Document Revised: 04/30/2017 Document Reviewed: 04/30/2017 Elsevier Patient Education  2020 [employer].  Pap Test Why am I having this test? A Pap test, also called a Pap smear, is a screening test to check for signs of:  Cancer of the vagina, cervix, and uterus. The cervix is the lower part of the uterus that opens into the vagina.  Infection.  Changes that may be a sign that cancer is developing (precancerous changes). Women need this test on a regular basis. In general, you should have a Pap test every 3 years until you reach menopause or age 60. Women aged 30-60 may choose to have their Pap test done at the same time as an HPV (human papillomavirus) test every 5 years (instead of every 3 years). Your health care provider may recommend having Pap tests more or less often depending on your medical conditions and past Pap test results. What kind  of sample is taken?  Your health care provider will collect a sample of cells from the surface of your cervix. This will be done using a small cotton swab, plastic spatula, or brush. This sample is often collected during a pelvic exam, when you are lying on your back on an exam table with feet in footrests (stirrups). In some cases, fluids (secretions) from the cervix or vagina may also be collected. How do I prepare for this test?  Be aware of where you are in your menstrual cycle. If you are menstruating on the day of the test, you may be asked to reschedule.  You may need to reschedule if you have a known vaginal infection on the day of the test.  Follow instructions from your health care provider about: ? Changing or stopping your regular medicines. Some medicines can cause abnormal test results, such as digitalis and tetracycline. ? Avoiding douching or taking a bath the day before or the day of the test. Tell a health care provider about:  Any allergies you have.  All medicines you are taking, including vitamins,  herbs, eye drops, creams, and over-the-counter medicines.  Any blood disorders you have.  Any surgeries you have had.  Any medical conditions you have.  Whether you are pregnant or may be pregnant. How are the results reported? Your test results will be reported as either abnormal or normal. A false-positive result can occur. A false positive is incorrect because it means that a condition is present when it is not. A false-negative result can occur. A false negative is incorrect because it means that a condition is not present when it is. What do the results mean? A normal test result means that you do not have signs of cancer of the vagina, cervix, or uterus. An abnormal result may mean that you have:  Cancer. A Pap test by itself is not enough to diagnose cancer. You will have more tests done in this case.  Precancerous changes in your vagina, cervix, or  uterus.  Inflammation of the cervix.  An STD (sexually transmitted disease).  A fungal infection.  A parasite infection. Talk with your health care provider about what your results mean. Questions to ask your health care provider Ask your health care provider, or the department that is doing the test:  When will my results be ready?  How will I get my results?  What are my treatment options?  What other tests do I need?  What are my next steps? Summary  In general, women should have a Pap test every 3 years until they reach menopause or age 77.  Your health care provider will collect a sample of cells from the surface of your cervix. This will be done using a small cotton swab, plastic spatula, or brush.  In some cases, fluids (secretions) from the cervix or vagina may also be collected. This information is not intended to replace advice given to you by your health care provider. Make sure you discuss any questions you have with your health care provider. Document Revised: 09/17/2016 Document Reviewed: 09/17/2016 Elsevier Patient Education  [place].

## 2019-03-30 NOTE — Addendum Note (Signed)
Addended by: Berniece Pap on: 03/30/2019 09:32 AM   Modules accepted: Kipp Brood

## 2019-03-30 NOTE — Progress Notes (Signed)
Patient: Betty Brennan Female    DOB: 1990-10-03   29 y.o.   MRN: [id number] Visit Date: 03/30/2019  Today's Provider: Jairo Ben, FNP   Chief Complaint  Patient presents with  . Follow-up    Otitis Media of left ear  . Gynecologic Exam   Subjective:     HPI  Follow up for Otitis Media  The patient was last seen for this 2 weeks ago. Changes made at last visit include patient was started on Amoxicillin 875mg  and Flonase nasal spray.She completed the antibiotics. She has noted more popping in her ears since starting the Flonase and completing the antibiotic. She denies any pain.   She reports good compliance with treatment. She feels that condition is Improved very slightly. hoiwever she does feel that she is improved from initial visit in January.  Patient reports that she is still having mild pressure in the left ear and states that both ears are "popping".  She is not having side effects.   She also would like to have PAP smear done today. She has no concerns. Her cycles are irregular, she will keep track of cycles. She has had one PAP smear 3 years ago denies any known abnormal results. We have not received records yet. She is not sexually active and is nervous for her PAP smear. She does not desire or need STD testing. She does not desire birth control. She has some cramping with her cycles that resolves in 1- 2 days with Ibuprofen and resolves completely by end of menstruation. Occasionally headache- mild with cycle and she takes Ibuprofen with that and it relieves.    She has seen orthopedics for her lower back pain and is doing Meloxicam 15 mg qd taking sporadically. She is doing Physical Therapy.  Patient  denies any fever, body aches,chills, rash, chest pain, shortness of breath, nausea, vomiting, or diarrhea.   ------------------------------------------------------------------------------------  No Known Allergies   Current Outpatient Medications:    .  fluticasone (FLONASE) 50 MCG/ACT nasal spray, Place 2 sprays into both nostrils daily., Disp: 16 g, Rfl: 1 .  meloxicam (MOBIC) 15 MG tablet, Take 15 mg by mouth daily., Disp: , Rfl:   Review of Systems  Constitutional: Negative.   HENT: Negative for congestion, dental problem, drooling, ear discharge, ear pain (pressure and popping in ears. ), facial swelling, hearing loss, mouth sores, nosebleeds, postnasal drip, rhinorrhea, sinus pressure, sinus pain, sneezing, sore throat, tinnitus, trouble swallowing and voice change.   Eyes: Negative.   Respiratory: Negative.  Negative for cough.   Cardiovascular: Negative.   Gastrointestinal: Negative.  Negative for abdominal pain, diarrhea and vomiting.  Genitourinary: Positive for menstrual problem (irregular cycles. ). Negative for decreased urine volume, difficulty urinating, dysuria, enuresis, flank pain, frequency, genital sores, hematuria, pelvic pain, urgency, vaginal bleeding, vaginal discharge and vaginal pain.  Musculoskeletal: Positive for arthralgias, back pain and neck pain (improving with nasal steroids. She also feels from staring at her phone she has muscular pain. She has been doing Physical therapy neck excercises and feels this is improving. ). Negative for gait problem, joint swelling, myalgias and neck stiffness.       Seeing orthopedics and doing Physical therapy - improving since January.   Skin: Negative for color change, pallor, rash and wound.       Enlarging skin lesion on chin - reports present x 1 year.   Allergic/Immunologic: Positive for environmental allergies.  Neurological: Negative.  Negative for headaches.  Psychiatric/Behavioral: Negative.     Social History   Tobacco Use  . Smoking status: Never Smoker  . Smokeless tobacco: Never Used  Substance Use Topics  . Alcohol use: Never      Objective:   BP 124/84   Pulse 76   Temp (!) 96.6 F (35.9 C) (Oral)   Resp 16   Wt 164 lb 6.4 oz (74.6 kg)   LMP  03/19/2018 (Exact Date)   SpO2 98%   BMI 30.07 kg/m  Vitals:   03/30/19 0811  BP: 124/84  Pulse: 76  Resp: 16  Temp: (!) 96.6 F (35.9 C)  TempSrc: Oral  SpO2: 98%  Weight: 164 lb 6.4 oz (74.6 kg)  Body mass index is 30.07 kg/m.   Physical Exam Vitals reviewed. Exam conducted with a chaperone present.  Constitutional:      General: She is not in acute distress.    Appearance: Normal appearance. She is well-developed. She is not ill-appearing, toxic-appearing or diaphoretic.  HENT:     Head: Normocephalic and atraumatic.     Jaw: There is normal jaw occlusion.     Salivary Glands: Right salivary gland is not diffusely enlarged or tender. Left salivary gland is not diffusely enlarged or tender.     Right Ear: Hearing, ear canal and external ear normal. A middle ear effusion is present. Tympanic membrane is not perforated, erythematous or bulging.     Left Ear: Hearing, ear canal and external ear normal. A middle ear effusion is present. Tympanic membrane is not perforated, erythematous (resolved with antibiotic since last visit.  ) or bulging.     Ears:     Comments:  Cobblestoning posterior pharynx; bilateral allergic shiners; bilateral TMs air fluid level clear; bilateral nasal turbinates mild edema erythema clear discharge;       Nose: Mucosal edema present. No congestion or rhinorrhea.     Right Sinus: Maxillary sinus tenderness present. No frontal sinus tenderness.     Left Sinus: No maxillary sinus tenderness or frontal sinus tenderness.     Mouth/Throat:     Mouth: Mucous membranes are moist.     Pharynx: Uvula midline. No oropharyngeal exudate or posterior oropharyngeal erythema.     Tonsils: No tonsillar exudate or tonsillar abscesses. 2+ on the right. 0 on the left.  Eyes:     General: Lids are normal. No scleral icterus.       Right eye: No discharge.        Left eye: No discharge.     Conjunctiva/sclera: Conjunctivae normal.     Pupils: Pupils are equal, round,  and reactive to light.  Neck:     Thyroid: No thyroid mass, thyromegaly or thyroid tenderness.     Vascular: No carotid bruit or JVD.     Trachea: Trachea normal. No tracheal deviation.     Meningeal: Brudzinski's sign absent.     Comments: Normal range of motion. No spinal tenderness. No crepitus.  Lateral muscles are slightly tight. Cardiovascular:     Rate and Rhythm: Normal rate and regular rhythm.     Heart sounds: Normal heart sounds. No murmur. No friction rub. No gallop.   Pulmonary:     Effort: Pulmonary effort is normal. No respiratory distress.     Breath sounds: Normal breath sounds. No stridor. No wheezing, rhonchi or rales.  Chest:     Chest wall: No tenderness.  Abdominal:     General: Bowel sounds are normal. There is no distension.  Palpations: Abdomen is soft. There is no mass.     Tenderness: There is no abdominal tenderness. There is no right CVA tenderness, left CVA tenderness, guarding or rebound.     Hernia: No hernia is present.  Genitourinary:    General: Normal vulva.     Exam position: Lithotomy position.     Pubic Area: No rash or pubic lice.      Tanner stage (genital): 5.     Labia:        Right: No rash, tenderness, lesion or injury.        Left: No rash, tenderness, lesion or injury.      Urethra: No prolapse, urethral pain, urethral swelling or urethral lesion.     Comments: PAP smear: she will return to office for this when she would like, unable to pass speculum x 2 attempts with patients permission  And patient very tense and nervous has only had one PAP three years ago. Used small speculum for both attempts. She is not sexually active. She rarely uses tampons. Exam stopped, patient is ok with rescheduling visit.  Musculoskeletal:        General: Normal range of motion.     Cervical back: Full passive range of motion without pain, normal range of motion and neck supple. No edema, erythema, signs of trauma, rigidity, torticollis, tenderness or  crepitus. No pain with movement, spinous process tenderness or muscular tenderness. Normal range of motion.  Lymphadenopathy:     Head:     Right side of head: No submental, submandibular, tonsillar, preauricular, posterior auricular or occipital adenopathy.     Left side of head: No submental, submandibular, tonsillar, preauricular, posterior auricular or occipital adenopathy.     Cervical: No cervical adenopathy (resolved since last visit. ).     Right cervical: No superficial, deep or posterior cervical adenopathy.    Left cervical: No superficial, deep or posterior cervical adenopathy.  Skin:    General: Skin is warm and dry.     Capillary Refill: Capillary refill takes less than 2 seconds.     Coloration: Skin is not pale.     Findings: Lesion present. No erythema or rash. Rash is not crusting or vesicular.          Comments: Tiny rough skin colored lesion with appearance of  wart like structure on chin, no drainage or blister. Question Molluscum contagiosum versus wart,   Neurological:     Mental Status: She is alert and oriented to person, place, and time.     GCS: GCS eye subscore is 4. GCS verbal subscore is 5. GCS motor subscore is 6.     Cranial Nerves: Cranial nerves are intact.     Sensory: Sensation is intact.     Motor: Motor function is intact.     Coordination: Coordination is intact.     Gait: Gait is intact. Gait normal.     Deep Tendon Reflexes:     Reflex Scores:      Tricep reflexes are 2+ on the right side and 2+ on the left side.      Brachioradialis reflexes are 2+ on the right side and 2+ on the left side.      Patellar reflexes are 2+ on the right side and 2+ on the left side. Psychiatric:        Attention and Perception: Attention and perception normal.        Mood and Affect: Mood and affect normal.  Speech: Speech normal.        Behavior: Behavior normal.        Thought Content: Thought content normal.        Cognition and Memory: Cognition and  memory normal.        Judgment: Judgment normal.      No results found for any visits on 03/30/19.     Assessment & Plan      1. Eustachian tube dysfunction, left Meds ordered this encounter  Medications  . cetirizine (ZYRTEC) 10 MG tablet    Sig: Take 1 tablet (10 mg total) by mouth daily.    Dispense:  90 tablet    Refill:  0  . azelastine (ASTELIN) 0.1 % nasal spray    Sig: Place 2 sprays into both nostrils 2 (two) times daily. Use in each nostril as directed    Dispense:  30 mL    Refill:  2   Medications Discontinued During This Encounter  Medication Reason  . amoxicillin (AMOXIL) 875 MG tablet Completed Course  . fluticasone (FLONASE) 50 MCG/ACT nasal spray     2. Neck pain- decrease screen time, try muscle relaxer PRN, she has not tried.   Continue follow up with orthopedics. Cervical x ray ordered at [facility] imaging she is aware she can walk in.  Orders Placed This Encounter  Procedures  . DG Cervical Spine Complete     3. Acute bilateral low back pain with right-sided sciatica Continue follow up with orthopedics, return if any symptoms persisting, changing or worsening.   Continue Physical Therapy.  PAP smear: she will return to office for this when she would like, unable to pass speculum x 2 attempts with patients permission  And patient very tense and nervous has only had one PAP three years ago. Used small speculum for both attempts. She is not sexually active. She rarely uses tampons. She is in agreement to return to attempt PAP smear again, She also is aware she may go to gynecology if more comfortable.     The entirety of the information documented in the History of Present Illness, Review of Systems and Physical Exam were personally obtained by me. Portions of this information were initially documented by the  Certified Medical Assistant whose name is documented in Epic and reviewed by me for thoroughness and accuracy.  I have personally performed  the exam and reviewed the chart and it is accurate to the best of my knowledge.  Museum/gallery conservator has been used and any errors in dictation or transcription are unintentional.  Eula Fried. Flinchum FNP-C  [facility]  Jairo Ben, FNP  [facility]

## 2019-05-04 ENCOUNTER — Encounter: Payer: Self-pay | Admitting: Adult Health

## 2019-05-04 ENCOUNTER — Ambulatory Visit (INDEPENDENT_AMBULATORY_CARE_PROVIDER_SITE_OTHER): Payer: 59 | Admitting: Adult Health

## 2019-05-04 ENCOUNTER — Other Ambulatory Visit: Payer: Self-pay

## 2019-05-04 ENCOUNTER — Other Ambulatory Visit (HOSPITAL_COMMUNITY)
Admission: RE | Admit: 2019-05-04 | Discharge: 2019-05-04 | Disposition: A | Discharge status: Home / Self Care | Payer: 59 | Source: Ambulatory Visit | Attending: Adult Health | Admitting: Adult Health

## 2019-05-04 VITALS — BP 100/62 | HR 88 | Temp 96.2°F | Resp 16 | Wt 168.0 lb

## 2019-05-04 DIAGNOSIS — Z124 Encounter for screening for malignant neoplasm of cervix: Secondary | ICD-10-CM | POA: Diagnosis not present

## 2019-05-04 DIAGNOSIS — Z01419 Encounter for gynecological examination (general) (routine) without abnormal findings: Secondary | ICD-10-CM

## 2019-05-04 DIAGNOSIS — S161XXA Strain of muscle, fascia and tendon at neck level, initial encounter: Secondary | ICD-10-CM

## 2019-05-04 NOTE — Progress Notes (Signed)
Established patient visit      Patient: Betty Brennan   DOB: 1990-06-11   29 y.o. Female  MRN: [id number] Visit Date: 05/04/2019  Today's healthcare provider: Jairo Ben, FNP  Subjective:    Chief Complaint  Patient presents with  . Gynecologic Exam   HPI Patient presents in office today for her yearly gynecology visit, patient reports that she feels fairly well today she would like to readdress neck and ear pain on left side. At last office visit patient was prescribed Meloxicam and Flexeril, patient reports good compliance and fair symptom control on medication.  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  She denies any known injury.  However I did previously order cervical x-rays she has not gone for it, however she agrees to go for a cervical x-ray since the systems are persisting to be sure that it is not any pathological bone concerns.  Patient has not taken the Flexeril, she has done stretches and has watch physical therapy exercises on YouTube.  She is doing physical therapy for her lower back with [facility].  Ports this is helping substantially.  Denies any paresthesias, loss of bowel or bladder control.   Is aware I can send her to physical therapy for her neck should symptoms be persistent and after she has an x-ray done.  She also is not taking her meloxicam, she was afraid to take it, reviewed symptoms and not to take with any other NSAIDs.  Patient will try meloxicam for a few weeks this was given to her from orthopedics for her lower back.  He is here for a Pap smear exam today, was unable to perform exam at last visit.  Patient is not sexually active has not been sexually active.  She was tenths for her last Pap smear, she is here again today  to try for Pap smear exam.  Patient  denies any fever, body aches,chills, rash, chest pain, shortness of breath, nausea, vomiting, or diarrhea.  She has no other concerns.      Medications: Outpatient Medications Prior to Visit  Medication Sig  . amoxicillin (AMOXIL) 875 MG tablet amoxicillin 875 mg tablet  . azelastine (ASTELIN) 0.1 % nasal spray Place 2 sprays into both nostrils 2 (two) times daily. Use in each nostril as directed  . cetirizine (ZYRTEC) 10 MG tablet Take 1 tablet (10 mg total) by mouth daily.  . cyclobenzaprine (FLEXERIL) 10 MG tablet cyclobenzaprine 10 mg tablet  TAKE 1 TABLET BY MOUTH EVERYDAY AT BEDTIME  . meloxicam (MOBIC) 15 MG tablet Take 15 mg by mouth daily.   No facility-administered medications prior to visit.    Review of Systems  Constitutional: Negative.   HENT: Negative.  Negative for ear pain, sneezing and tinnitus.   Eyes: Negative.   Respiratory: Negative.   Cardiovascular: Negative.   Gastrointestinal: Negative.   Genitourinary: Negative.   Musculoskeletal: Positive for arthralgias (Lower back is seen by [facility] and doing physical therapy for mild scoliosis and moderate disc narrowing and L5-S1.  She has mild joint slipping in L2 and L3 and is followed by orthopedics currently in physical therapy.) and neck pain (left sided ). Negative for back pain, gait problem, joint swelling, myalgias and neck stiffness.  Neurological: Negative for dizziness,  seizures, syncope, facial asymmetry, speech difficulty, weakness, light-headedness, numbness and headaches.  Psychiatric/Behavioral: Negative.         Objective:    Today's Vitals   05/04/19 0816  BP: 100/62  Pulse: 88  Resp: 16  Temp: (!) 96.2 F (35.7 C)  TempSrc: Oral  SpO2: 98%  Weight: 168 lb (76.2 kg)   Body mass index is 30.73 kg/m.;   Physical Exam Vitals reviewed. Exam conducted with a chaperone present.  Constitutional:      General: She is not in acute distress.     Appearance: She is well-developed. She is not diaphoretic.     Interventions: She is not intubated. HENT:     Head: Atraumatic. Macrocephalic.     Jaw: There is normal jaw occlusion.     Salivary Glands: Right salivary gland is not diffusely enlarged or tender. Left salivary gland is not diffusely enlarged or tender.     Right Ear: Hearing, tympanic membrane, ear canal and external ear normal.     Left Ear: Hearing, tympanic membrane, ear canal and external ear normal.     Nose: Nose normal.     Mouth/Throat:     Lips: Pink.     Mouth: Mucous membranes are moist.     Tongue: No lesions.     Pharynx: Oropharynx is clear. Uvula midline. No pharyngeal swelling, oropharyngeal exudate, posterior oropharyngeal erythema or uvula swelling.     Tonsils: 1+ on the right. 1+ on the left.  Eyes:     General: Lids are normal. No scleral icterus.       Right eye: No discharge.        Left eye: No discharge.     Conjunctiva/sclera: Conjunctivae normal.     Right eye: Right conjunctiva is not injected. No exudate or hemorrhage.    Left eye: Left conjunctiva is not injected. No exudate or hemorrhage.    Pupils: Pupils are equal, round, and reactive to light.  Neck:     Thyroid: No thyroid mass or thyromegaly.     Vascular: Normal carotid pulses. No carotid bruit, hepatojugular reflux or JVD.     Trachea: Trachea and phonation normal. No tracheal tenderness or tracheal deviation.     Meningeal: Brudzinski's sign and Kernig's sign absent.  Cardiovascular:     Rate and Rhythm: Normal rate and regular rhythm.     Pulses: Normal pulses.          Radial pulses are 2+ on the right side and 2+ on the left side.       Dorsalis pedis pulses are 2+ on the right side and 2+ on the left side.       Posterior tibial pulses are 2+ on the right side and 2+ on the left side.     Heart sounds: Normal heart sounds, S1 normal and S2 normal. Heart sounds not distant. No murmur. No friction rub. No gallop.    Pulmonary:     Effort: Pulmonary effort is normal. No tachypnea, bradypnea, accessory muscle usage or respiratory distress. She is not intubated.     Breath sounds: Normal breath sounds. No stridor. No wheezing or rales.  Chest:     Chest wall: No tenderness.  Abdominal:     General: Bowel sounds are normal. There is no distension or abdominal bruit.     Palpations: Abdomen is soft. There is no shifting dullness, fluid wave, hepatomegaly, splenomegaly, mass or pulsatile mass.     Tenderness: There is no abdominal  tenderness. There is no guarding or rebound.     Hernia: No hernia is present. There is no hernia in the left inguinal area or right inguinal area.  Genitourinary:    Exam position: Lithotomy position.     Pubic Area: No rash or pubic lice.      Tanner stage (genital): 5.     Labia:        Right: No rash, tenderness, lesion or injury.        Left: No rash, tenderness, lesion or injury.      Urethra: No prolapse, urethral pain, urethral swelling or urethral lesion.     Vagina: Normal.     Cervix: Normal.     Uterus: Normal.      Adnexa: Right adnexa normal and left adnexa normal.       Right: No mass, tenderness or fullness.         Left: No mass, tenderness or fullness.       Rectum: Normal.     Comments: Patient is tense during exam, she is able to relax and take a deep breath, lighted small speculum was used.  Patient tolerated exam without difficulty however she was very tense and nervous with exam and she did have a small amount of bleeding post exam.  Cervix was normal in appearance.  Os was completely closed. Bimanual within normal limits.    No rectal guaiac performed, rectum appears normal.  Patient has no rectal complaints or concerns. Musculoskeletal:        General: No tenderness or deformity. Normal range of motion.     Cervical back: Full passive range of motion without pain, normal range of motion and neck supple. No edema, erythema or rigidity. No spinous  process tenderness or muscular tenderness. Normal range of motion.     Comments: Sternocleidomastoid muscle on the left is where patient reports her discomfort usually comes from, intermittent.  Patient is not having pain today on exam.  Normal exam.  Thyroid normal.  No lymphadenopathy.  Lymphadenopathy:     Head:     Right side of head: No submental, submandibular, tonsillar, preauricular, posterior auricular or occipital adenopathy.     Left side of head: No submental, submandibular, tonsillar, preauricular, posterior auricular or occipital adenopathy.     Cervical: No cervical adenopathy.     Right cervical: No superficial, deep or posterior cervical adenopathy.    Left cervical: No superficial, deep or posterior cervical adenopathy.     Upper Body:     Right upper body: No supraclavicular or pectoral adenopathy.     Left upper body: No supraclavicular or pectoral adenopathy.     Lower Body: No right inguinal adenopathy. No left inguinal adenopathy.  Skin:    General: Skin is warm and dry.     Coloration: Skin is not pale.     Findings: No abrasion, bruising, burn, ecchymosis, erythema, lesion, petechiae or rash.     Nails: There is no clubbing.  Neurological:     Mental Status: She is alert and oriented to person, place, and time.     GCS: GCS eye subscore is 4. GCS verbal subscore is 5. GCS motor subscore is 6.     Cranial Nerves: Cranial nerves are intact. No cranial nerve deficit.     Sensory: Sensation is intact. No sensory deficit.     Motor: Motor function is intact. No tremor, atrophy, abnormal muscle tone or seizure activity.     Coordination: Coordination is intact.  Coordination normal.     Gait: Gait is intact. Gait normal.     Deep Tendon Reflexes: Reflexes are normal and symmetric. Reflexes normal. Babinski sign absent on the right side. Babinski sign absent on the left side.     Reflex Scores:      Tricep reflexes are 2+ on the right side and 2+ on the left side.       Bicep reflexes are 2+ on the right side and 2+ on the left side.      Brachioradialis reflexes are 2+ on the right side and 2+ on the left side.      Patellar reflexes are 2+ on the right side and 2+ on the left side.      Achilles reflexes are 2+ on the right side and 2+ on the left side. Psychiatric:        Speech: Speech normal.        Behavior: Behavior normal.        Thought Content: Thought content normal.        Judgment: Judgment normal.       No results found for any visits on 05/04/19.    Assessment & Plan:    Screening for cervical cancer - Plan: Cytology - PAP, DG Cervical Spine Complete  Strain of neck muscle, initial encounter  Encounter for gynecological examination   Orders Placed This Encounter  Procedures  . DG Cervical Spine Complete    Order Specific Question:   Reason for Exam (SYMPTOM  OR DIAGNOSIS REQUIRED)    Answer:   left sided neck pains. felt to be muscular howere has been persistent.    Order Specific Question:   Is patient pregnant?    Answer:   No    Order Specific Question:   Preferred imaging location?    Answer:   [facility]    Order Specific Question:   Radiology Contrast Protocol - do NOT remove file path    Answer:   \\[email or web address]   Discussed heat/ ice / stretching. Follow up if not improving immediately. Go for cervical; x ray as orederd. May try Meloxicam and Flexeril if she would like as directed. Avoid other NSAIDS with Meloxicam. Keep follow up with [facility] as referred  regarding back pain and x ray results as well as continue physiocal therapy. Discussed signs of emergent back pain and when to seek care immediately.   Advised PAP results should return in 3 days, repeat in 3 years pending cytology/ PAP results-  Sooner if  abnormal or any symptoms in future warrant sooner evaluation  TIME visit 35 minutes spent with patient, additional 10 minutes spent reviewing chart.   Advised patient call the office or your primary care doctor for an appointment if no improvement within 72 hours or if any symptoms change or worsen at any time  Advised ER or urgent Care if after hours or on weekend. Call 911 for emergency symptoms at any time.Patinet verbalized understanding of all instructions given/reviewed and treatment plan and has no further questions or concerns at this time.    Return if symptoms worsen or fail to improve, for at any time for any worsening symptoms, Go to Emergency room/ urgent care if worse.   The entirety of the information documented in the History of Present Illness, Review of Systems and Physical Exam were personally obtained by me. Portions of this information were initially documented by the  Certified Medical Assistant whose name is documented in Epic and reviewed  by me for thoroughness and accuracy.  I have personally performed the exam and reviewed the chart and it is accurate to the best of my knowledge.  Museum/gallery conservatorDragon technology has been used and any errors in dictation or transcription are unintentional.  .billi` Eula FriedMichelle S. Nakya Weyand FNP-C  [facility]   Jairo BenMichelle Smith Aryav Wimberly, FNP  [facility] [phone] (phone) [phone] (fax)  [facility]

## 2019-05-04 NOTE — Patient Instructions (Addendum)
Cervical Strain and Sprain Rehab Ask your health care provider which exercises are safe for you. Do exercises exactly as told by your health care provider and adjust them as directed. It is normal to feel mild stretching, pulling, tightness, or discomfort as you do these exercises. Stop right away if you feel sudden pain or your pain gets worse. Do not begin these exercises until told by your health care provider. Stretching and range-of-motion exercises Cervical side bending  1. Using good posture, sit on a stable chair or stand up. 2. Without moving your shoulders, slowly tilt your left / right ear to your shoulder until you feel a stretch in the opposite side neck muscles. You should be looking straight ahead. 3. Hold for __________ seconds. 4. Repeat with the other side of your neck. Repeat __________ times. Complete this exercise __________ times a day. Cervical rotation  1. Using good posture, sit on a stable chair or stand up. 2. Slowly turn your head to the side as if you are looking over your left / right shoulder. ? Keep your eyes level with the ground. ? Stop when you feel a stretch along the side and the back of your neck. 3. Hold for __________ seconds. 4. Repeat this by turning to your other side. Repeat __________ times. Complete this exercise __________ times a day. Thoracic extension and pectoral stretch 1. Roll a towel or a small blanket so it is about 4 inches (10 cm) in diameter. 2. Lie down on your back on a firm surface. 3. Put the towel lengthwise, under your spine in the middle of your back. It should not be under your shoulder blades. The towel should line up with your spine from your middle back to your lower back. 4. Put your hands behind your head and let your elbows fall out to your sides. 5. Hold for __________ seconds. Repeat __________ times. Complete this exercise __________ times a day. Strengthening exercises Isometric upper cervical flexion 1. Lie on  your back with a thin pillow behind your head and a small rolled-up towel under your neck. 2. Gently tuck your chin toward your chest and nod your head down to look toward your feet. Do not lift your head off the pillow. 3. Hold for __________ seconds. 4. Release the tension slowly. Relax your neck muscles completely before you repeat this exercise. Repeat __________ times. Complete this exercise __________ times a day. Isometric cervical extension  1. Stand about 6 inches (15 cm) away from a wall, with your back facing the wall. 2. Place a soft object, about 6-8 inches (15-20 cm) in diameter, between the back of your head and the wall. A soft object could be a small pillow, a ball, or a folded towel. 3. Gently tilt your head back and press into the soft object. Keep your jaw and forehead relaxed. 4. Hold for __________ seconds. 5. Release the tension slowly. Relax your neck muscles completely before you repeat this exercise. Repeat __________ times. Complete this exercise __________ times a day. Posture and body mechanics Body mechanics refers to the movements and positions of your body while you do your daily activities. Posture is part of body mechanics. Good posture and healthy body mechanics can help to relieve stress in your body's tissues and joints. Good posture means that your spine is in its natural S-curve position (your spine is neutral), your shoulders are pulled back slightly, and your head is not tipped forward. The following are general guidelines for applying improved   posture and body mechanics to your everyday activities. Sitting  1. When sitting, keep your spine neutral and keep your feet flat on the floor. Use a footrest, if necessary, and keep your thighs parallel to the floor. Avoid rounding your shoulders, and avoid tilting your head forward. 2. When working at a desk or a computer, keep your desk at a height where your hands are slightly lower than your elbows. Slide your  chair under your desk so you are close enough to maintain good posture. 3. When working at a computer, place your monitor at a height where you are looking straight ahead and you do not have to tilt your head forward or downward to look at the screen. Standing   When standing, keep your spine neutral and keep your feet about hip-width apart. Keep a slight bend in your knees. Your ears, shoulders, and hips should line up.  When you do a task in which you stand in one place for a long time, place one foot up on a stable object that is 2-4 inches (5-10 cm) high, such as a footstool. This helps keep your spine neutral. Resting When lying down and resting, avoid positions that are most painful for you. Try to support your neck in a neutral position. You can use a contour pillow or a small rolled-up towel. Your pillow should support your neck but not push on it. This information is not intended to replace advice given to you by your health care provider. Make sure you discuss any questions you have with your health care provider. Document Revised: 04/30/2018 Document Reviewed: 10/09/2017 Else[employer] Patient Education  2020 [employer] Muscle Strain A muscle strain is an injury that happens when a muscle is stretched longer than normal. This can happen during a fall, sports, or lifting. This can tear some muscle fibers. Usually, recovery from muscle strain takes 1-2 weeks. Complete healing normally takes 5-6 weeks. This condition is first treated with PRICE therapy. This involves:  Protecting your muscle from being injured again.  Resting your injured muscle.  Icing your injured muscle.  Applying pressure (compression) to your injured muscle. This may be done with a splint or elastic bandage.  Raising (elevating) your injured muscle. Your doctor may also recommend medicine for pain. Follow these instructions at home: If you have a splint:  Wear the splint as told by your doctor. Take it off  only as told by your doctor.  Loosen the splint if your fingers or toes tingle, get numb, or turn cold and blue.  Keep the splint clean.  If the splint is not waterproof: ? Do not let it get wet. ? Cover it with a watertight covering when you take a bath or a shower. Managing pain, stiffness, and swelling   If directed, put ice on your injured area. ? If you have a removable splint, take it off as told by your doctor. ? Put ice in a plastic bag. ? Place a towel between your skin and the bag. ? Leave the ice on for 20 minutes, 2-3 times a day.  Move your fingers or toes often. This helps to avoid stiffness and lessen swelling.  Raise your injured area above the level of your heart while you are sitting or lying down.  Wear an elastic bandage as told by your doctor. Make sure it is not too tight. General instructions  Take over-the-counter and prescription medicines only as told by your doctor.  Limit your activity. Rest your injured  muscle as told by your doctor. Your doctor may say that gentle movements are okay.  If physical therapy was prescribed, do exercises as told by your doctor.  Do not put pressure on any part of the splint until it is fully hardened. This may take many hours.  Do not use any products that contain nicotine or tobacco, such as cigarettes and e-cigarettes. These can delay bone healing. If you need help quitting, ask your doctor.  Warm up before you exercise. This helps to prevent more muscle strains.  Ask your doctor when it is safe to drive if you have a splint.  Keep all follow-up visits as told by your doctor. This is important. Contact a doctor if:  You have more pain or swelling in your injured area. Get help right away if:  You have any of these problems in your injured area: ? You have numbness. ? You have tingling. ? You lose a lot of strength. Summary  A muscle strain is an injury that happens when a muscle is stretched longer than  normal.  This condition is first treated with PRICE therapy. This includes protecting, resting, icing, adding pressure, and raising your injury.  Limit your activity. Rest your injured muscle as told by your doctor. Your doctor may say that gentle movements are okay.  Warm up before you exercise. This helps to prevent more muscle strains. This information is not intended to replace advice given to you by your health care provider. Make sure you discuss any questions you have with your health care provider. Document Revised: 03/06/2018 Document Reviewed: 02/15/2016 Elsevier Patient Education  [place]. Globus Pharyngeus Globus pharyngeus is a condition that makes it feel like you have a lump in your throat. It may also feel like you have something stuck in the front of your throat. This feeling may come and go. It is not painful, and it does not make it harder to swallow food or liquid. Globus pharyngeus does not cause changes that a health care provider can see during a physical exam. This condition usually goes away without treatment. What are the causes? Often, no cause can be found. The most common cause of globus pharyngeus is a condition that causes stomach juices to flow back up into the throat (gastroesophageal reflux). Other possible causes include:  Overstimulation of nerves that control swallowing.  Irritation of nerves that control swallowing (neuralgia).  An enlarged gland in the lower neck (thyroid gland).  Growth of tonsil tissue at the base of the tongue (lingual tonsil).  Anxiety.  Depression. What are the signs or symptoms? The main symptom of this condition is a feeling of a lump in your throat. This feeling usually comes and goes. How is this diagnosed? This condition may be diagnosed after other conditions have been ruled out. You may have tests, such as:  A swallow study.  Ear, nose, and throat evaluation.  An exam of your throat using a thin,  flexible tube with a light and camera on the end (endoscopy). How is this treated? This condition may go away on its own, without treatment. In some cases, antidepressant medicines may be helpful. Follow these instructions at home:   Follow instructions from your health care provider about eating or drinking restrictions.  Take over-the-counter and prescription medicines only as told by your health care provider.  Keep all follow-up visits as told by your health care provider. This is important.  Follow instructions from your health care provider about home  care for gastroesophageal reflux. Your health care provider may recommend that you: ? Do not eat or drink anything that causes heartburn. ? Do not eat heavy meals close to bedtime. ? Do not drink caffeine. ? Do not drink alcohol. ? Raise the head of your bed. ? Sleep on your left side. Contact a health care provider if:  Your symptoms get worse.  You have throat pain.  You have trouble swallowing.  Food or liquid comes back up into your mouth.  You lose weight without trying. Get help right away if:  You develop swelling in your throat. Summary  Globus pharyngeus is a condition that makes it feel like you have a lump in your throat.  This condition usually goes away without treatment. This information is not intended to replace advice given to you by your health care provider. Make sure you discuss any questions you have with your health care provider. Document Revised: 12/21/2016 Document Reviewed: 09/14/2015 Elsevier Patient Education  2020 [employer].

## 2019-05-05 LAB — CYTOLOGY - PAP: Diagnosis: NEGATIVE

## 2019-05-05 NOTE — Progress Notes (Signed)
Released to My Chart with message to follow up with office if questions/ concerns.  PAP smear negative for abnormal cells, or malignancy.  Repeat 3 around or after 05/04/2022 years unless clinically indicated sooner.

## 2019-05-21 ENCOUNTER — Ambulatory Visit
Admission: RE | Admit: 2019-05-21 | Discharge: 2019-05-21 | Disposition: A | Discharge status: Home / Self Care | Payer: 59 | Source: Ambulatory Visit | Attending: Adult Health | Admitting: Adult Health

## 2019-05-21 ENCOUNTER — Other Ambulatory Visit: Payer: Self-pay

## 2019-05-21 DIAGNOSIS — Z124 Encounter for screening for malignant neoplasm of cervix: Secondary | ICD-10-CM | POA: Diagnosis present

## 2019-05-21 DIAGNOSIS — M542 Cervicalgia: Secondary | ICD-10-CM | POA: Diagnosis present

## 2019-05-22 NOTE — Progress Notes (Signed)
Cervical spine x-ray is within normal limits. Follow up if symptoms persist or worsen is recommended at anytime.

## 2019-07-21 ENCOUNTER — Other Ambulatory Visit: Payer: Self-pay

## 2019-07-21 ENCOUNTER — Encounter: Payer: Self-pay | Admitting: Adult Health

## 2019-07-21 ENCOUNTER — Ambulatory Visit (INDEPENDENT_AMBULATORY_CARE_PROVIDER_SITE_OTHER): Payer: 59 | Admitting: Adult Health

## 2019-07-21 VITALS — BP 122/84 | HR 91 | Temp 96.6°F | Resp 16 | Wt 166.0 lb

## 2019-07-21 DIAGNOSIS — J392 Other diseases of pharynx: Secondary | ICD-10-CM

## 2019-07-21 DIAGNOSIS — H6993 Unspecified Eustachian tube disorder, bilateral: Secondary | ICD-10-CM

## 2019-07-21 DIAGNOSIS — H6983 Other specified disorders of Eustachian tube, bilateral: Secondary | ICD-10-CM

## 2019-07-21 DIAGNOSIS — K219 Gastro-esophageal reflux disease without esophagitis: Secondary | ICD-10-CM

## 2019-07-21 DIAGNOSIS — H938X3 Other specified disorders of ear, bilateral: Secondary | ICD-10-CM

## 2019-07-21 DIAGNOSIS — Z6830 Body mass index (BMI) 30.0-30.9, adult: Secondary | ICD-10-CM

## 2019-07-21 DIAGNOSIS — H6504 Acute serous otitis media, recurrent, right ear: Secondary | ICD-10-CM

## 2019-07-21 MED ORDER — PANTOPRAZOLE SODIUM 40 MG PO TBEC: 40.0000 mg | DELAYED_RELEASE_TABLET | Freq: Every day | ORAL | 0 refills | Status: DC

## 2019-07-21 MED ORDER — FLUTICASONE PROPIONATE 50 MCG/ACT NA SUSP: 2.0000 | Freq: Every day | NASAL | 1 refills | Status: AC

## 2019-07-21 MED ORDER — AMOXICILLIN-POT CLAVULANATE 875-125 MG PO TABS: 1.0000 | ORAL_TABLET | Freq: Two times a day (BID) | ORAL | 0 refills | Status: DC

## 2019-07-21 NOTE — Patient Instructions (Signed)
Otitis Media, Adult  Otitis media means that the middle ear is red and swollen (inflamed) and full of fluid. The condition usually goes away on its own. Follow these instructions at home:  Take over-the-counter and prescription medicines only as told by your doctor.  If you were prescribed an antibiotic medicine, take it as told by your doctor. Do not stop taking the antibiotic even if you start to feel better.  Keep all follow-up visits as told by your doctor. This is important. Contact a doctor if:  You have bleeding from your nose.  There is a lump on your neck.  You are not getting better in 5 days.  You feel worse instead of better. Get help right away if:  You have pain that is not helped with medicine.  You have swelling, redness, or pain around your ear.  You get a stiff neck.  You cannot move part of your face (paralyzed).  You notice that the bone behind your ear hurts when you touch it.  You get a very bad headache. Summary  Otitis media means that the middle ear is red, swollen, and full of fluid.  This condition usually goes away on its own. In some cases, treatment may be needed.  If you were prescribed an antibiotic medicine, take it as told by your doctor. This information is not intended to replace advice given to you by your health care provider. Make sure you discuss any questions you have with your health care provider. Document Revised: 12/21/2016 Document Reviewed: 01/30/2016 Elsevier Patient Education  2020 [employer] Eustachian Tube Dysfunction  Eustachian tube dysfunction refers to a condition in which a blockage develops in the narrow passage that connects the middle ear to the back of the nose (eustachian tube). The eustachian tube regulates air pressure in the middle ear by letting air move between the ear and nose. It also helps to drain fluid from the middle ear space. Eustachian tube dysfunction can affect one or both ears. When the  eustachian tube does not function properly, air pressure, fluid, or both can build up in the middle ear. What are the causes? This condition occurs when the eustachian tube becomes blocked or cannot open normally. Common causes of this condition include:  Ear infections.  Colds and other infections that affect the nose, mouth, and throat (upper respiratory tract).  Allergies.  Irritation from cigarette smoke.  Irritation from stomach acid coming up into the esophagus (gastroesophageal reflux). The esophagus is the tube that carries food from the mouth to the stomach.  Sudden changes in air pressure, such as from descending in an airplane or scuba diving.  Abnormal growths in the nose or throat, such as: ? Growths that line the nose (nasal polyps). ? Abnormal growth of cells (tumors). ? Enlarged tissue at the back of the throat (adenoids). What increases the risk? You are more likely to develop this condition if:  You smoke.  You are overweight.  You are a child who has: ? Certain birth defects of the mouth, such as cleft palate. ? Large tonsils or adenoids. What are the signs or symptoms? Common symptoms of this condition include:  A feeling of fullness in the ear.  Ear pain.  Clicking or popping noises in the ear.  Ringing in the ear.  Hearing loss.  Loss of balance.  Dizziness. Symptoms may get worse when the air pressure around you changes, such as when you travel to an area of high elevation, fly on  an airplane, or go scuba diving. How is this diagnosed? This condition may be diagnosed based on:  Your symptoms.  A physical exam of your ears, nose, and throat.  Tests, such as those that measure: ? The movement of your eardrum (tympanogram). ? Your hearing (audiometry). How is this treated? Treatment depends on the cause and severity of your condition.  In mild cases, you may relieve your symptoms by moving air into your ears. This is called "popping the  ears."  In more severe cases, or if you have symptoms of fluid in your ears, treatment may include: ? Medicines to relieve congestion (decongestants). ? Medicines that treat allergies (antihistamines). ? Nasal sprays or ear drops that contain medicines that reduce swelling (steroids). ? A procedure to drain the fluid in your eardrum (myringotomy). In this procedure, a small tube is placed in the eardrum to:  Drain the fluid.  Restore the air in the middle ear space. ? A procedure to insert a balloon device through the nose to inflate the opening of the eustachian tube (balloon dilation). Follow these instructions at home: Lifestyle  Do not do any of the following until your health care provider approves: ? Travel to high altitudes. ? Fly in airplanes. ? Work in a Estate agent or room. ? Scuba dive.  Do not use any products that contain nicotine or tobacco, such as cigarettes and e-cigarettes. If you need help quitting, ask your health care provider.  Keep your ears dry. Wear fitted earplugs during showering and bathing. Dry your ears completely after. General instructions  Take over-the-counter and prescription medicines only as told by your health care provider.  Use techniques to help pop your ears as recommended by your health care provider. These may include: ? Chewing gum. ? Yawning. ? Frequent, forceful swallowing. ? Closing your mouth, holding your nose closed, and gently blowing as if you are trying to blow air out of your nose.  Keep all follow-up visits as told by your health care provider. This is important. Contact a health care provider if:  Your symptoms do not go away after treatment.  Your symptoms come back after treatment.  You are unable to pop your ears.  You have: ? A fever. ? Pain in your ear. ? Pain in your head or neck. ? Fluid draining from your ear.  Your hearing suddenly changes.  You become very dizzy.  You lose your  balance. Summary  Eustachian tube dysfunction refers to a condition in which a blockage develops in the eustachian tube.  It can be caused by ear infections, allergies, inhaled irritants, or abnormal growths in the nose or throat.  Symptoms include ear pain, hearing loss, or ringing in the ears.  Mild cases are treated with maneuvers to unblock the ears, such as yawning or ear popping.  Severe cases are treated with medicines. Surgery may also be done (rare). This information is not intended to replace advice given to you by your health care provider. Make sure you discuss any questions you have with your health care provider. Document Revised: 04/30/2017 Document Reviewed: 04/30/2017 Elsevier Patient Education  2020 [employer] Gastroesophageal Reflux Disease, Adult Gastroesophageal reflux (GER) happens when acid from the stomach flows up into the tube that connects the mouth and the stomach (esophagus). Normally, food travels down the esophagus and stays in the stomach to be digested. With GER, food and stomach acid sometimes move back up into the esophagus. You may have a disease called gastroesophageal reflux  disease (GERD) if the reflux:  Happens often.  Causes frequent or very bad symptoms.  Causes problems such as damage to the esophagus. When this happens, the esophagus becomes sore and swollen (inflamed). Over time, GERD can make small holes (ulcers) in the lining of the esophagus. What are the causes? This condition is caused by a problem with the muscle between the esophagus and the stomach. When this muscle is weak or not normal, it does not close properly to keep food and acid from coming back up from the stomach. The muscle can be weak because of:  Tobacco use.  Pregnancy.  Having a certain type of hernia (hiatal hernia).  Alcohol use.  Certain foods and drinks, such as coffee, chocolate, onions, and peppermint. What increases the risk? You are more likely to  develop this condition if you:  Are overweight.  Have a disease that affects your connective tissue.  Use NSAID medicines. What are the signs or symptoms? Symptoms of this condition include:  Heartburn.  Difficult or painful swallowing.  The feeling of having a lump in the throat.  A bitter taste in the mouth.  Bad breath.  Having a lot of saliva.  Having an upset or bloated stomach.  Belching.  Chest pain. Different conditions can cause chest pain. Make sure you see your doctor if you have chest pain.  Shortness of breath or noisy breathing (wheezing).  Ongoing (chronic) cough or a cough at night.  Wearing away of the surface of teeth (tooth enamel).  Weight loss. How is this treated? Treatment will depend on how bad your symptoms are. Your doctor may suggest:  Changes to your diet.  Medicine.  Surgery. Follow these instructions at home: Eating and drinking   Follow a diet as told by your doctor. You may need to avoid foods and drinks such as: ? Coffee and tea (with or without caffeine). ? Drinks that contain alcohol. ? Energy drinks and sports drinks. ? Bubbly (carbonated) drinks or sodas. ? Chocolate and cocoa. ? Peppermint and mint flavorings. ? Garlic and onions. ? Horseradish. ? Spicy and acidic foods. These include peppers, chili powder, curry powder, vinegar, hot sauces, and BBQ sauce. ? Citrus fruit juices and citrus fruits, such as oranges, lemons, and limes. ? Tomato-based foods. These include red sauce, chili, salsa, and pizza with red sauce. ? Fried and fatty foods. These include donuts, french fries, potato chips, and high-fat dressings. ? High-fat meats. These include hot dogs, rib eye steak, sausage, ham, and bacon. ? High-fat dairy items, such as whole milk, butter, and cream cheese.  Eat small meals often. Avoid eating large meals.  Avoid drinking large amounts of liquid with your meals.  Avoid eating meals during the 2-3 hours  before bedtime.  Avoid lying down right after you eat.  Do not exercise right after you eat. Lifestyle   Do not use any products that contain nicotine or tobacco. These include cigarettes, e-cigarettes, and chewing tobacco. If you need help quitting, ask your doctor.  Try to lower your stress. If you need help doing this, ask your doctor.  If you are overweight, lose an amount of weight that is healthy for you. Ask your doctor about a safe weight loss goal. General instructions  Pay attention to any changes in your symptoms.  Take over-the-counter and prescription medicines only as told by your doctor. Do not take aspirin, ibuprofen, or other NSAIDs unless your doctor says it is okay.  Wear loose clothes. Do not  wear anything tight around your waist.  Raise (elevate) the head of your bed about 6 inches (15 cm).  Avoid bending over if this makes your symptoms worse.  Keep all follow-up visits as told by your doctor. This is important. Contact a doctor if:  You have new symptoms.  You lose weight and you do not know why.  You have trouble swallowing or it hurts to swallow.  You have wheezing or a cough that keeps happening.  Your symptoms do not get better with treatment.  You have a hoarse voice. Get help right away if:  You have pain in your arms, neck, jaw, teeth, or back.  You feel sweaty, dizzy, or light-headed.  You have chest pain or shortness of breath.  You throw up (vomit) and your throw-up looks like blood or coffee grounds.  You pass out (faint).  Your poop (stool) is bloody or black.  You cannot swallow, drink, or eat. Summary  If a person has gastroesophageal reflux disease (GERD), food and stomach acid move back up into the esophagus and cause symptoms or problems such as damage to the esophagus.  Treatment will depend on how bad your symptoms are.  Follow a diet as told by your doctor.  Take all medicines only as told by your doctor. This  information is not intended to replace advice given to you by your health care provider. Make sure you discuss any questions you have with your health care provider. Document Revised: 07/17/2017 Document Reviewed: 07/17/2017 Elsevier Patient Education  2020 [employer]. Food Choices for Gastroesophageal Reflux Disease, Adult When you have gastroesophageal reflux disease (GERD), the foods you eat and your eating habits are very important. Choosing the right foods can help ease your discomfort. Think about working with a nutrition specialist (dietitian) to help you make good choices. What are tips for following this plan?  Meals  Choose healthy foods that are low in fat, such as fruits, vegetables, whole grains, low-fat dairy products, and lean meat, fish, and poultry.  Eat small meals often instead of 3 large meals a day. Eat your meals slowly, and in a place where you are relaxed. Avoid bending over or lying down until 2-3 hours after eating.  Avoid eating meals 2-3 hours before bed.  Avoid drinking a lot of liquid with meals.  Cook foods using methods other than frying. Bake, grill, or broil food instead.  Avoid or limit: ? Chocolate. ? Peppermint or spearmint. ? Alcohol. ? Pepper. ? Black and decaffeinated coffee. ? Black and decaffeinated tea. ? Bubbly (carbonated) soft drinks. ? Caffeinated energy drinks and soft drinks.  Limit high-fat foods such as: ? Fatty meat or fried foods. ? Whole milk, cream, butter, or ice cream. ? Nuts and nut butters. ? Pastries, donuts, and sweets made with butter or shortening.  Avoid foods that cause symptoms. These foods may be different for everyone. Common foods that cause symptoms include: ? Tomatoes. ? Oranges, lemons, and limes. ? Peppers. ? Spicy food. ? Onions and garlic. ? Vinegar. Lifestyle  Maintain a healthy weight. Ask your doctor what weight is healthy for you. If you need to lose weight, work with your doctor to do so  safely.  Exercise for at least 30 minutes for 5 or more days each week, or as told by your doctor.  Wear loose-fitting clothes.  Do not smoke. If you need help quitting, ask your doctor.  Sleep with the head of your bed higher than your feet. Use  a wedge under the mattress or blocks under the bed frame to raise the head of the bed. Summary  When you have gastroesophageal reflux disease (GERD), food and lifestyle choices are very important in easing your symptoms.  Eat small meals often instead of 3 large meals a day. Eat your meals slowly, and in a place where you are relaxed.  Limit high-fat foods such as fatty meat or fried foods.  Avoid bending over or lying down until 2-3 hours after eating.  Avoid peppermint and spearmint, caffeine, alcohol, and chocolate. This information is not intended to replace advice given to you by your health care provider. Make sure you discuss any questions you have with your health care provider. Document Revised: 05/01/2018 Document Reviewed: 02/14/2016 Elsevier Patient Education  2020 [employer]. Amoxicillin; Clavulanic Acid Tablets What is this medicine? AMOXICILLIN; CLAVULANIC ACID (a mox i SIL in; KLAV yoo lan ic AS id) is a penicillin antibiotic. It treats some infections caused by bacteria. It will not work for colds, the flu, or other viruses. This medicine may be used for other purposes; ask your health care provider or pharmacist if you have questions. COMMON BRAND NAME(S): Augmentin What should I tell my health care provider before I take this medicine? They need to know if you have any of these conditions:  bowel disease, like colitis  kidney disease  liver disease  mononucleosis  an unusual or allergic reaction to amoxicillin, penicillin, cephalosporin, other antibiotics, clavulanic acid, other medicines, foods, dyes, or preservatives  pregnant or trying to get pregnant  breast-feeding How should I use this medicine? Take  this drug by mouth. Take it as directed on the prescription label at the same time every day. Take it with food at the start of a meal or snack. Take all of this drug unless your health care provider tells you to stop it early. Keep taking it even if you think you are better. Talk to your health care provider about the use of this drug in children. While it may be prescribed for selected conditions, precautions do apply. Overdosage: If you think you have taken too much of this medicine contact a poison control center or emergency room at once. NOTE: This medicine is only for you. Do not share this medicine with others. What if I miss a dose? If you miss a dose, take it as soon as you can. If it is almost time for your next dose, take only that dose. Do not take double or extra doses. What may interact with this medicine?  allopurinol  anticoagulants  birth control pills  methotrexate  probenecid This list may not describe all possible interactions. Give your health care provider a list of all the medicines, herbs, non-prescription drugs, or dietary supplements you use. Also tell them if you smoke, drink alcohol, or use illegal drugs. Some items may interact with your medicine. What should I watch for while using this medicine? Tell your doctor or healthcare provider if your symptoms do not improve. This medicine may cause serious skin reactions. They can happen weeks to months after starting the medicine. Contact your healthcare provider right away if you notice fevers or flu-like symptoms with a rash. The rash may be red or purple and then turn into blisters or peeling of the skin. Or, you might notice a red rash with swelling of the face, lips or lymph nodes in your neck or under your arms. Do not treat diarrhea with over the counter products. Contact  your doctor if you have diarrhea that lasts more than 2 days or if it is severe and watery. If you have diabetes, you may get a false-positive  result for sugar in your urine. Check with your doctor or healthcare provider. Birth control pills may not work properly while you are taking this medicine. Talk to your doctor about using an extra method of birth control. What side effects may I notice from receiving this medicine? Side effects that you should report to your doctor or health care professional as soon as possible:  allergic reactions like skin rash, itching or hives, swelling of the face, lips, or tongue  breathing problems  dark urine  fever or chills, sore throat  redness, blistering, peeling, or loosening of the skin, including inside the mouth  seizures  trouble passing urine or change in the amount of urine  unusual bleeding, bruising  unusually weak or tired  white patches or sores in the mouth or throat Side effects that usually do not require medical attention (report to your doctor or health care professional if they continue or are bothersome):  diarrhea  dizziness  headache  nausea, vomiting  stomach upset  vaginal or anal irritation This list may not describe all possible side effects. Call your doctor for medical advice about side effects. You may report side effects to FDA at 1-800-FDA-1088. Where should I keep my medicine? Keep out of the reach of children and pets. Store at room temperature between 20 and 25 degrees C (68 and 77 degrees F). Throw away any unused drug after the expiration date. NOTE: This sheet is a summary. It may not cover all possible information. If you have questions about this medicine, talk to your doctor, pharmacist, or health care provider.  2020 Elsevier/[employer] (2018-08-11 11:55:53)

## 2019-07-21 NOTE — Progress Notes (Signed)
Established patient visit   Patient: Betty Brennan   DOB: 01-Mar-1990   29 y.o. Female  MRN: [id number] Visit Date: 07/21/2019  Today's healthcare provider: Jairo Ben, FNP   Chief Complaint  Patient presents with  . Follow-up   Subjective    Ear Fullness  There is pain in both ears. This is a chronic problem. The current episode started 1 to 4 weeks ago. The problem occurs constantly (does not go away, feels good in the morning and worse as day proceeds ). The problem has been unchanged (was better last week. ). There has been no fever. The fever has been present for less than 1 day. The pain is at a severity of 0/10. The pain is mild. Associated symptoms include ear discharge (increased cerumen only ) and neck pain (muscle pains have improved in neck ). Pertinent negatives include no abdominal pain, coughing, diarrhea, headaches, hearing loss, rhinorrhea or sore throat. She has tried antibiotics and ear drops (sinus meds and azestalin nasal spray and zyrtec,) for the symptoms. The treatment provided mild relief. There is no history of a chronic ear infection, hearing loss or a tympanostomy tube.   Patient returns back to office today for follow up visit to address fullness in her right ear.She had pain in the ear last week that seems to have improved some.  Patient reports since visit she was able to clear out some of the wax in her ears with debrox but states that she has had some slight pain and fullness back in her ear. Patient reports that she was taking zyrtec to help with seasonal allergies like symptoms but states that compliance was poor when symptom initially resolved.   Patient reports that when she takes otc antihistamine to control allergy like symptoms it makes her very drowsy and difficult to function throughout the day, patient states that she has discontinued zyrtec due to fatigue.  Has not been using nasal sprays as directed everyday but does try to do so.    Denies any hearing loss.  She has been using Debrox on both ears. She does wear headphones a while.  She restarted her meloxicam that orthopedics had given her and developed Reflux she stopped it and it helped. She stopped MeloxicamDenies any further burning in throat.Denies sore throat  She feels she has a tighter throat as the days go on she reports at today's visit denies any food or allergy triggers. .  She has been drinking lemon juice for this and seems to think " it may be helping "   She denies any abdominal pain.   Patient  denies any fever, chills, rash, chest pain, shortness of breath, nausea, vomiting, or diarrhea.   Denies dizziness, lightheadedness, pre syncopal or syncopal episodes.                                                        Patient Active Problem List   Diagnosis Date Noted  . Eustachian tube dysfunction, left 03/30/2019  . Abnormal x-ray of lumbar spine 03/04/2019  . Neck pain 02/26/2019  . Vitamin D deficiency 02/26/2019  . Acute bilateral low back pain 02/26/2019  . Screening for thyroid disorder 02/26/2019  . Impacted cerumen of both ears 02/26/2019   History reviewed. No pertinent past medical history. Social History  Tobacco Use  . Smoking status: Never Smoker  . Smokeless tobacco: Never Used  Substance Use Topics  . Alcohol use: Never  . Drug use: Never   No Known Allergies     Medications: Outpatient Medications Prior to Visit  Medication Sig  . cetirizine (ZYRTEC) 10 MG tablet Take 1 tablet (10 mg total) by mouth daily.  Marland Kitchen. azelastine (ASTELIN) 0.1 % nasal spray Place 2 sprays into both nostrils 2 (two) times daily. Use in each nostril as directed (Patient not taking: Reported on 05/04/2019)  . cyclobenzaprine (FLEXERIL) 10 MG tablet cyclobenzaprine 10 mg tablet  TAKE 1 TABLET BY MOUTH EVERYDAY AT BEDTIME (Patient not taking: Reported on 07/21/2019)  . meloxicam (MOBIC) 15 MG tablet Take 15 mg by mouth daily. (Patient not taking:  Reported on 07/21/2019)   No facility-administered medications prior to visit.    Review of Systems  Constitutional: Positive for fatigue.  HENT: Positive for ear discharge (increased cerumen only ) and ear pain. Negative for hearing loss, rhinorrhea, sinus pressure, sinus pain, sneezing, sore throat, tinnitus and trouble swallowing.   Eyes: Negative.   Respiratory: Negative.  Negative for cough.   Cardiovascular: Negative.   Gastrointestinal: Negative for abdominal pain and diarrhea.  Musculoskeletal: Positive for neck pain (muscle pains have improved in neck ).  Neurological: Negative for headaches.       Objective    BP 122/84   Pulse 91   Temp (!) 96.6 F (35.9 C) (Oral)   Resp 16   Wt 166 lb (75.3 kg)   SpO2 99%   BMI 30.36 kg/m     Physical Exam Vitals reviewed.  Constitutional:      General: She is not in acute distress.    Appearance: Normal appearance. She is well-developed. She is not ill-appearing, toxic-appearing or diaphoretic.  HENT:     Head: Normocephalic and atraumatic.     Right Ear: Hearing, ear canal and external ear normal. A middle ear effusion is present. No mastoid tenderness. Tympanic membrane is erythematous and bulging. Tympanic membrane is not perforated.     Left Ear: Hearing, ear canal and external ear normal.  No middle ear effusion. No mastoid tenderness. Tympanic membrane is not perforated, erythematous or bulging.     Nose: Mucosal edema, congestion and rhinorrhea present.     Right Sinus: No maxillary sinus tenderness or frontal sinus tenderness.     Left Sinus: No maxillary sinus tenderness or frontal sinus tenderness.     Mouth/Throat:     Lips: Pink.     Mouth: Mucous membranes are moist. No oral lesions.     Pharynx: Oropharynx is clear. Uvula midline. No oropharyngeal exudate or posterior oropharyngeal erythema.     Tonsils: No tonsillar exudate or tonsillar abscesses. 2+ on the right. 0 on the left.     Comments:  Cobblestoning  posterior pharynx; bilateral allergic shiners;bilateral nasal turbinates mild edema erythema clear discharge;    Eyes:     General: Lids are normal. Allergic shiner present. No scleral icterus.       Right eye: No discharge.        Left eye: No discharge.     Conjunctiva/sclera: Conjunctivae normal.     Pupils: Pupils are equal, round, and reactive to light.  Neck:     Vascular: No JVD.     Trachea: Trachea normal. No tracheal deviation.     Meningeal: Brudzinski's sign absent.  Cardiovascular:     Rate and Rhythm: Normal rate and  regular rhythm.     Heart sounds: Normal heart sounds. No murmur heard.  No friction rub. No gallop.   Pulmonary:     Effort: Pulmonary effort is normal. No respiratory distress.     Breath sounds: Normal breath sounds. No stridor. No wheezing or rales.  Chest:     Chest wall: No tenderness.  Abdominal:     General: Bowel sounds are normal.     Palpations: Abdomen is soft.  Musculoskeletal:        General: Normal range of motion.     Cervical back: Full passive range of motion without pain, normal range of motion and neck supple.  Lymphadenopathy:     Head:     Right side of head: No submental, submandibular, tonsillar, preauricular, posterior auricular or occipital adenopathy.     Left side of head: No submental, submandibular, tonsillar, preauricular, posterior auricular or occipital adenopathy.     Cervical: No cervical adenopathy.     Right cervical: No superficial, deep or posterior cervical adenopathy.    Left cervical: No superficial, deep or posterior cervical adenopathy.  Skin:    General: Skin is warm and dry.     Capillary Refill: Capillary refill takes less than 2 seconds.     Coloration: Skin is not pale.     Findings: No erythema or rash.  Neurological:     Mental Status: She is alert and oriented to person, place, and time.  Psychiatric:        Speech: Speech normal.        Behavior: Behavior normal.        Thought Content: Thought  content normal.        Judgment: Judgment normal.      No results found for any visits on 07/21/19.  Assessment & Plan     1. Gastroesophageal reflux disease, unspecified whether esophagitis present 21 day trial of Protonix to see if throat feels better, denies any allergic reaction now or in past or any known trigger other than taking Meloxicam.  - pantoprazole (PROTONIX) 40 MG tablet; Take 1 tablet (40 mg total) by mouth daily.  Dispense: 21 tablet; Refill: 0  2. Eustachian tube dysfunction, bilateral May try sudafed as needed per package instructions.. Continue Flonase and Astelin nasal spray as directed. Since chronic will have ENT evaluate Recommend taking Zyrtec at night if making drowsy and she is willing to try this.  - Ambulatory referral to ENT  3. Recurrent acute serous otitis media of right ear Meds ordered this encounter  Medications  . fluticasone (FLONASE) 50 MCG/ACT nasal spray    Sig: Place 2 sprays into both nostrils daily.    Dispense:  16 g    Refill:  1  . pantoprazole (PROTONIX) 40 MG tablet    Sig: Take 1 tablet (40 mg total) by mouth daily.    Dispense:  21 tablet    Refill:  0  . amoxicillin-clavulanate (AUGMENTIN) 875-125 MG tablet    Sig: Take 1 tablet by mouth 2 (two) times daily.    Dispense:  20 tablet    Refill:  0   - Ambulatory referral to ENT  4. Sensation of fullness in both ears - Ambulatory referral to ENT As above continue medication allergy.   5. Throat irritation Start GERD medication trial. Will have ENT evaluate. She has bee advised ok to use Benadryl if needed, throat lozenges, and salt water gargles. Return to clinic if  any worsening and keep log. She  denies any allergic symptoms or difficulty swallowing. 911 for emergent RED flag symptoms.  - Ambulatory referral to ENT   Return if symptoms worsen or fail to improve/ ENT refferal., for at any time for any worsening symptoms, Go to Emergency room/ urgent care if worse.       Advised patient call the office or your primary care doctor for an appointment if no improvement within 72 hours or if any symptoms change or worsen at any time  Advised ER or urgent Care if after hours or on weekend. Call 911 for emergency symptoms at any time.Patinet verbalized understanding of all instructions given/reviewed and treatment plan and has no further questions or concerns at this time.      IBeverely Pace Britini Garcilazo, FNP, have reviewed all documentation for this visit. The documentation on 07/22/19 for the exam, diagnosis, procedures, and orders are all accurate and complete.   Jairo Ben, FNP  [facility] [phone] (phone) [phone] (fax)  [facility]

## 2019-08-16 ENCOUNTER — Other Ambulatory Visit: Payer: Self-pay | Admitting: Adult Health

## 2019-08-16 DIAGNOSIS — K219 Gastro-esophageal reflux disease without esophagitis: Secondary | ICD-10-CM

## 2019-08-16 NOTE — Telephone Encounter (Signed)
Requested medications are due for refill today?  Yes   Requested medications are on active medication list?  Yes  Last Refill:  07/21/2019 # 21 with no refills - 21 day trial of Protonix.    Future visit scheduled?  No   Notes to Clinic:  This medication was filled as a 21 day trial period for problem of GERD / irritated throat.  Also, per provider note there was also mention of patient being referred to ENT.

## 2020-01-21 ENCOUNTER — Ambulatory Visit: Payer: 59 | Admitting: Gastroenterology

## 2020-01-21 ENCOUNTER — Other Ambulatory Visit: Payer: Self-pay

## 2020-01-21 VITALS — BP 118/75 | HR 85 | Ht 66.0 in | Wt 171.2 lb

## 2020-01-21 DIAGNOSIS — K2100 Gastro-esophageal reflux disease with esophagitis, without bleeding: Secondary | ICD-10-CM

## 2020-01-21 DIAGNOSIS — K219 Gastro-esophageal reflux disease without esophagitis: Secondary | ICD-10-CM

## 2020-01-21 NOTE — Progress Notes (Signed)
Gastroenterology Consultation  Referring Provider:     Wendy Poet* Primary Care Physician:  Berniece Pap, FNP Primary Gastroenterologist:  Dr. Servando Snare     Reason for Consultation:     Reflux        HPI:   Kesa Birky is a 29 y.o. y/o female referred for consultation & management of reflux by Dr. Ashok Norris, Eula Fried, FNP.  This patient comes in today after being seen in the past by her primary care provider back in June of this year.  At that time the patient was tried on Protonix for 21 days.  In October the patient was seen by ENT for eustachian tube dysfunction.  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  The patient was then referred to come see me today. She was switched to Dexilant a month ago. The patient reports of Dexilant is working for much better than the pantoprazole but she still continues to have some intermittent symptoms.  The patient denies any unexplained weight loss fevers chills nausea or vomiting.  She does report some reflux symptoms and did have changes seen by ENT consistent with reflux.  She denies any overt heartburn.  The patient also states that her diarrhea is not very good and states that she needs to lose weight and she believes that that may help her symptoms.  No past medical history on file.  No past surgical history on file.  Prior to Admission medications   Medication Sig Start Date End Date Taking? Authorizing Provider  amoxicillin-clavulanate (AUGMENTIN) 875-125 MG tablet Take 1 tablet by mouth 2 (two) times daily. 07/21/19   Flinchum, Eula Fried, FNP  azelastine (ASTELIN) 0.1 % nasal spray Place 2 sprays into both nostrils 2 (two) times daily. Use in each nostril as directed Patient not taking: Reported on 05/04/2019 03/30/19   Flinchum, Eula Fried, FNP  cetirizine (ZYRTEC) 10 MG tablet Take 1 tablet (10 mg total) by mouth daily. 03/30/19   Flinchum, Eula Fried, FNP   cyclobenzaprine (FLEXERIL) 10 MG tablet cyclobenzaprine 10 mg tablet  TAKE 1 TABLET BY MOUTH EVERYDAY AT BEDTIME Patient not taking: Reported on 07/21/2019    [provider]  fluticasone (FLONASE) 50 MCG/ACT nasal spray Place 2 sprays into both nostrils daily. 07/21/19   Flinchum, Eula Fried, FNP  meloxicam (MOBIC) 15 MG tablet Take 15 mg by mouth daily. Patient not taking: Reported on 07/21/2019 03/05/19   [provider]  pantoprazole (PROTONIX) 40 MG tablet TAKE 1 TABLET BY MOUTH EVERY DAY 08/17/19   Flinchum, Eula Fried, FNP    Family History  Problem Relation Age of Onset  . Hypertension Maternal Grandmother   . Alzheimer's disease Maternal Grandmother   . Lung cancer Maternal Grandfather   . Stomach cancer Paternal Grandmother   . Congestive Heart Failure Paternal Grandfather      Social History   Tobacco Use  . Smoking status: Never Smoker  . Smokeless tobacco: Never Used  Substance Use Topics  . Alcohol use: Never  . Drug use: Never    Allergies as of 01/21/2020  . (No Known Allergies)    Review of Systems:    All systems reviewed and negative except where noted in HPI.   Physical Exam:  BP 118/75   Pulse 85   Ht 5\' 6"  (1.676 m)   Wt 171 lb 3.2 oz (77.7 kg)   BMI 27.63 kg/m  No LMP recorded. General:  Alert,  Well-developed, well-nourished, pleasant and cooperative in NAD Head:  Normocephalic and atraumatic. Eyes:  Sclera clear, no icterus.   Conjunctiva pink. Ears:  Normal auditory acuity. Neck:  Supple; no masses or thyromegaly. Lungs:  Respirations even and unlabored.  Clear throughout to auscultation.   No wheezes, crackles, or rhonchi. No acute distress. Heart:  Regular rate and rhythm; no murmurs, clicks, rubs, or gallops. Abdomen:  Normal bowel sounds.  No bruits.  Soft, non-tender and non-distended without masses, hepatosplenomegaly or hernias noted.  No guarding or rebound tenderness.  Negative Carnett sign.   Rectal:  Deferred.   Pulses:  Normal pulses noted. Extremities:  No clubbing or edema.  No cyanosis. Neurologic:  Alert and oriented x3;  grossly normal neurologically. Skin:  Intact without significant lesions or rashes.  No jaundice. Lymph Nodes:  No significant cervical adenopathy. Psych:  Alert and cooperative. Normal mood and affect.  Imaging Studies: No results found.  Assessment and Plan:   Louanna Vanliew is a 29 y.o. y/o female Who comes in today with long-standing symptoms of reflux that is helped with Dexilant better than it was with Protonix but she continues to have some issues.  The patient will continue the Dexilant and has been told that she may have silent reflux.  She will be set up for a upper endoscopy to look for any signs of acid breakthrough, esophagitis or a hiatal hernia.  The patient also reports some intermittent mushy stools and she has been told to try and increase fiber in her diet and she agrees with this plan.  The patient will follow up at the time of the upper endoscopy.    Midge Minium, MD. Clementeen Graham    Note: This dictation was prepared with Dragon dictation along with smaller phrase technology. Any transcriptional errors that result from this process are unintentional.

## 2020-01-25 ENCOUNTER — Other Ambulatory Visit: Payer: Self-pay

## 2020-01-25 ENCOUNTER — Encounter: Payer: Self-pay | Admitting: Gastroenterology

## 2020-02-01 NOTE — Discharge Instructions (Signed)

## 2020-02-02 ENCOUNTER — Other Ambulatory Visit: Payer: Self-pay

## 2020-02-02 ENCOUNTER — Other Ambulatory Visit
Admission: RE | Admit: 2020-02-02 | Discharge: 2020-02-02 | Disposition: A | Discharge status: Home / Self Care | Payer: 59 | Source: Ambulatory Visit | Attending: Gastroenterology | Admitting: Gastroenterology

## 2020-02-02 DIAGNOSIS — Z20822 Contact with and (suspected) exposure to covid-19: Secondary | ICD-10-CM | POA: Diagnosis not present

## 2020-02-02 DIAGNOSIS — Z01812 Encounter for preprocedural laboratory examination: Secondary | ICD-10-CM | POA: Insufficient documentation

## 2020-02-02 LAB — SARS CORONAVIRUS 2 (TAT 6-24 HRS): SARS Coronavirus 2: NEGATIVE

## 2020-02-04 ENCOUNTER — Encounter: Payer: Self-pay | Admitting: Gastroenterology

## 2020-02-04 ENCOUNTER — Encounter
Admission: RE | Disposition: A | Discharge status: Home / Self Care | Payer: Self-pay | Source: Home / Self Care | Attending: Gastroenterology

## 2020-02-04 ENCOUNTER — Ambulatory Visit: Payer: 59 | Admitting: Anesthesiology

## 2020-02-04 ENCOUNTER — Ambulatory Visit
Admission: RE | Admit: 2020-02-04 | Discharge: 2020-02-04 | Disposition: A | Discharge status: Home / Self Care | Payer: 59 | Attending: Gastroenterology | Admitting: Gastroenterology

## 2020-02-04 DIAGNOSIS — Z79899 Other long term (current) drug therapy: Secondary | ICD-10-CM | POA: Insufficient documentation

## 2020-02-04 DIAGNOSIS — K219 Gastro-esophageal reflux disease without esophagitis: Secondary | ICD-10-CM | POA: Insufficient documentation

## 2020-02-04 DIAGNOSIS — K2100 Gastro-esophageal reflux disease with esophagitis, without bleeding: Secondary | ICD-10-CM

## 2020-02-04 DIAGNOSIS — K449 Diaphragmatic hernia without obstruction or gangrene: Secondary | ICD-10-CM | POA: Insufficient documentation

## 2020-02-04 HISTORY — PX: ESOPHAGOGASTRODUODENOSCOPY (EGD) WITH PROPOFOL: SHX5813

## 2020-02-04 HISTORY — DX: Low back pain, unspecified: M54.50

## 2020-02-04 HISTORY — DX: Gastro-esophageal reflux disease without esophagitis: K21.9

## 2020-02-04 HISTORY — DX: Migraine, unspecified, not intractable, without status migrainosus: G43.909

## 2020-02-04 LAB — POCT PREGNANCY, URINE: Preg Test, Ur: NEGATIVE

## 2020-02-04 SURGERY — ESOPHAGOGASTRODUODENOSCOPY (EGD) WITH PROPOFOL
Anesthesia: General

## 2020-02-04 MED ORDER — GLYCOPYRROLATE 0.2 MG/ML IJ SOLN
INTRAMUSCULAR | Status: DC | PRN
  Administered 2020-02-04: .1 mg via INTRAVENOUS

## 2020-02-04 MED ORDER — PROPOFOL 10 MG/ML IV BOLUS
INTRAVENOUS | Status: DC | PRN
  Administered 2020-02-04: 200 mg via INTRAVENOUS

## 2020-02-04 MED ORDER — LIDOCAINE HCL (CARDIAC) PF 100 MG/5ML IV SOSY
PREFILLED_SYRINGE | INTRAVENOUS | Status: DC | PRN
  Administered 2020-02-04: 30 mg via INTRAVENOUS

## 2020-02-04 MED ORDER — LACTATED RINGERS IV SOLN: INTRAVENOUS | Status: DC

## 2020-02-04 MED ORDER — SODIUM CHLORIDE 0.9 % IV SOLN: INTRAVENOUS | Status: DC

## 2020-02-04 SURGICAL SUPPLY — 34 items
BALLN DILATOR 10-12 8 (BALLOONS)
BALLN DILATOR 12-15 8 (BALLOONS)
BALLN DILATOR 15-18 8 (BALLOONS)
BALLN DILATOR CRE 0-12 8 (BALLOONS)
BALLN DILATOR ESOPH 8 10 CRE (MISCELLANEOUS) IMPLANT
BALLOON DILATOR 12-15 8 (BALLOONS) IMPLANT
BALLOON DILATOR 15-18 8 (BALLOONS) IMPLANT
BALLOON DILATOR CRE 0-12 8 (BALLOONS) IMPLANT
BLOCK BITE 60FR ADLT L/F GRN (MISCELLANEOUS) ×2 IMPLANT
CLIP HMST 235XBRD CATH ROT (MISCELLANEOUS) IMPLANT
CLIP RESOLUTION 360 11X235 (MISCELLANEOUS)
ELECT REM PT RETURN 9FT ADLT (ELECTROSURGICAL)
ELECTRODE REM PT RTRN 9FT ADLT (ELECTROSURGICAL) IMPLANT
FCP ESCP3.2XJMB 240X2.8X (MISCELLANEOUS)
FORCEPS BIOP RAD 4 LRG CAP 4 (CUTTING FORCEPS) ×2 IMPLANT
FORCEPS BIOP RJ4 240 W/NDL (MISCELLANEOUS)
FORCEPS ESCP3.2XJMB 240X2.8X (MISCELLANEOUS) IMPLANT
GOWN CVR UNV OPN BCK APRN NK (MISCELLANEOUS) ×2 IMPLANT
GOWN ISOL THUMB LOOP REG UNIV (MISCELLANEOUS) ×4
INJECTOR VARIJECT VIN23 (MISCELLANEOUS) IMPLANT
KIT DEFENDO VALVE AND CONN (KITS) IMPLANT
KIT PRC NS LF DISP ENDO (KITS) ×1 IMPLANT
KIT PROCEDURE OLYMPUS (KITS) ×2
MANIFOLD NEPTUNE II (INSTRUMENTS) ×2 IMPLANT
MARKER SPOT ENDO TATTOO 5ML (MISCELLANEOUS) IMPLANT
RETRIEVER NET PLAT FOOD (MISCELLANEOUS) IMPLANT
SNARE SHORT THROW 13M SML OVAL (MISCELLANEOUS) IMPLANT
SNARE SHORT THROW 30M LRG OVAL (MISCELLANEOUS) IMPLANT
SPOT EX ENDOSCOPIC TATTOO (MISCELLANEOUS)
SYR INFLATION 60ML (SYRINGE) IMPLANT
TRAP ETRAP POLY (MISCELLANEOUS) IMPLANT
VARIJECT INJECTOR VIN23 (MISCELLANEOUS)
WATER STERILE IRR 250ML POUR (IV SOLUTION) ×2 IMPLANT
WIRE CRE 18-20MM 8CM F G (MISCELLANEOUS) IMPLANT

## 2020-02-04 NOTE — Anesthesia Procedure Notes (Signed)
Date/Time: 02/04/2020 9:59 AM Performed by: Maree Krabbe, CRNA Pre-anesthesia Checklist: Patient identified, Emergency Drugs available, Suction available, Timeout performed and Patient being monitored Patient Re-evaluated:Patient Re-evaluated prior to induction Oxygen Delivery Method: Nasal cannula Placement Confirmation: positive ETCO2

## 2020-02-04 NOTE — H&P (Signed)
Midge Minium, MD [facility] [address], [address] [place], [place] [address] Phone:[phone] Fax : [phone]  Primary Care Physician:  Berniece Pap, FNP Primary Gastroenterologist:  Dr. Servando Snare  Pre-Procedure History & Physical: HPI:  Betty Brennan is a 30 y.o. female is here for an endoscopy.   Past Medical History:  Diagnosis Date  . Back pain, lumbosacral   . GERD (gastroesophageal reflux disease)   . Migraine headache    with monthly cycle    Past Surgical History:  Procedure Laterality Date  . ESOPHAGOGASTRODUODENOSCOPY     penny removal from throat (when in 1st grade)    Prior to Admission medications   Medication Sig Start Date End Date Taking? Authorizing Provider  Dexlansoprazole (DEXILANT) 30 MG capsule Take 30 mg by mouth daily.   Yes [provider]  fluticasone (FLONASE) 50 MCG/ACT nasal spray Place 2 sprays into both nostrils daily. 07/21/19  Yes Flinchum, Eula Fried, FNP  amoxicillin-clavulanate (AUGMENTIN) 875-125 MG tablet Take 1 tablet by mouth 2 (two) times daily. Patient not taking: Reported on 01/25/2020 07/21/19   Flinchum, Eula Fried, FNP  azelastine (ASTELIN) 0.1 % nasal spray Place 2 sprays into both nostrils 2 (two) times daily. Use in each nostril as directed Patient not taking: No sig reported 03/30/19   Flinchum, Eula Fried, FNP  cetirizine (ZYRTEC) 10 MG tablet Take 1 tablet (10 mg total) by mouth daily. Patient not taking: Reported on 01/25/2020 03/30/19   Flinchum, Eula Fried, FNP  cyclobenzaprine (FLEXERIL) 10 MG tablet cyclobenzaprine 10 mg tablet  TAKE 1 TABLET BY MOUTH EVERYDAY AT BEDTIME Patient not taking: No sig reported    [provider]  meloxicam (MOBIC) 15 MG tablet Take 15 mg by mouth daily. Patient not taking: No sig reported 03/05/19   [provider]  pantoprazole (PROTONIX) 40 MG tablet TAKE 1 TABLET BY MOUTH EVERY DAY Patient not taking: Reported on 01/25/2020 08/17/19   Berniece Pap, FNP     Allergies as of 01/21/2020  . (No Known Allergies)    Family History  Problem Relation Age of Onset  . Hypertension Maternal Grandmother   . Alzheimer's disease Maternal Grandmother   . Lung cancer Maternal Grandfather   . Stomach cancer Paternal Grandmother   . Congestive Heart Failure Paternal Grandfather     Social History   Socioeconomic History  . Marital status: Unknown    Spouse name: Not on file  . Number of children: Not on file  . Years of education: Not on file  . Highest education level: Not on file  Occupational History  . Not on file  Tobacco Use  . Smoking status: Never Smoker  . Smokeless tobacco: Never Used  Vaping Use  . Vaping Use: Never used  Substance and Sexual Activity  . Alcohol use: Never  . Drug use: Never  . Sexual activity: Not Currently  Other Topics Concern  . Not on file  Social History Narrative  . Not on file   Social Determinants of Health   Financial Resource Strain: Not on file  Food Insecurity: Not on file  Transportation Needs: Not on file  Physical Activity: Not on file  Stress: Not on file  Social Connections: Not on file  Intimate Partner Violence: Not on file    Review of Systems: See HPI, otherwise negative ROS  Physical Exam: Ht 5\' 2"  (1.575 m)   Wt 77.6 kg   LMP 01/22/2020 (Exact Date) Comment: preg test neg  BMI 31.28 kg/m  General:   Alert,  pleasant and cooperative in NAD Head:  Normocephalic and atraumatic. Neck:  Supple; no masses or thyromegaly. Lungs:  Clear throughout to auscultation.    Heart:  Regular rate and rhythm. Abdomen:  Soft, nontender and nondistended. Normal bowel sounds, without guarding, and without rebound.   Neurologic:  Alert and  oriented x4;  grossly normal neurologically.  Impression/Plan: Betty Brennan is here for an endoscopy to be performed for GERD  Risks, benefits, limitations, and alternatives regarding  endoscopy have been reviewed with the patient.  Questions  have been answered.  All parties agreeable.   Midge Minium, MD  02/04/2020, 8:58 AM

## 2020-02-04 NOTE — Transfer of Care (Signed)
Immediate Anesthesia Transfer of Care Note  Patient: Betty Brennan  Procedure(s) Performed: ESOPHAGOGASTRODUODENOSCOPY (EGD) WITH PROPOFOL (N/A )  Patient Location: PACU  Anesthesia Type: General  Level of Consciousness: awake, alert  and patient cooperative  Airway and Oxygen Therapy: Patient Spontanous Breathing and Patient connected to supplemental oxygen  Post-op Assessment: Post-op Vital signs reviewed, Patient's Cardiovascular Status Stable, Respiratory Function Stable, Patent Airway and No signs of Nausea or vomiting  Post-op Vital Signs: Reviewed and stable  Complications: No complications documented.

## 2020-02-04 NOTE — Anesthesia Postprocedure Evaluation (Signed)
Anesthesia Post Note  Patient: Betty Brennan  Procedure(s) Performed: ESOPHAGOGASTRODUODENOSCOPY (EGD) WITH PROPOFOL (N/A )     Patient location during evaluation: PACU Anesthesia Type: General Level of consciousness: awake and alert and oriented Pain management: satisfactory to patient Vital Signs Assessment: post-procedure vital signs reviewed and stable Respiratory status: spontaneous breathing, nonlabored ventilation and respiratory function stable Cardiovascular status: blood pressure returned to baseline and stable Postop Assessment: Adequate PO intake and No signs of nausea or vomiting Anesthetic complications: no   No complications documented.  Cherly Beach

## 2020-02-04 NOTE — Op Note (Signed)
[facility] Patient Name: Betty Brennan Procedure Date: 02/04/2020 9:30 AM MRN: [id number] Account #: [id number] Date of Birth: 1990-09-11 Admit Type: Outpatient Age: 30 Room: [facility] OR ROOM 01 Gender: Female Note Status: Finalized Procedure:             Upper GI endoscopy Indications:           Heartburn Providers:             Midge Minium MD, MD Referring MD:          Eula Fried. Flinchum (Referring MD) Medicines:             Propofol per Anesthesia Complications:         No immediate complications. Procedure:             Pre-Anesthesia Assessment:                        - Prior to the procedure, a History and Physical was                         performed, and patient medications and allergies were                         reviewed. The patient's tolerance of previous                         anesthesia was also reviewed. The risks and benefits                         of the procedure and the sedation options and risks                         were discussed with the patient. All questions were                         answered, and informed consent was obtained. Prior                         Anticoagulants: The patient has taken no previous                         anticoagulant or antiplatelet agents. ASA Grade                         Assessment: II - A patient with mild systemic disease.                         After reviewing the risks and benefits, the patient                         was deemed in satisfactory condition to undergo the                         procedure.                        After obtaining informed consent, the endoscope was  passed under direct vision. Throughout the procedure,                         the patient's blood pressure, pulse, and oxygen                         saturations were monitored continuously. The was                         introduced through the mouth, and advanced to the                          second part of duodenum. The upper GI endoscopy was                         accomplished without difficulty. The patient tolerated                         the procedure well. Findings:      A small hiatal hernia was present.      The stomach was normal.      The examined duodenum was normal.      Two biopsies were obtained with cold forceps for histology in the lower       third of the esophagus. Impression:            - Small hiatal hernia.                        - Normal stomach.                        - Normal examined duodenum.                        - Biopsy performed in the lower third of the esophagus. Recommendation:        - Discharge patient to home.                        - Resume previous diet.                        - Continue present medications.                        - Await pathology results. Procedure Code(s):     --- Professional ---                        [phone], Esophagogastroduodenoscopy, flexible,                         transoral; with biopsy, single or multiple Diagnosis Code(s):     --- Professional ---                        R12, Heartburn CPT copyright 2019 [employer]. All rights reserved. The codes documented in this report are preliminary and upon coder review may  be revised to meet current compliance requirements. Midge Minium MD, MD 02/04/2020 10:04:11 AM This report has been signed electronically. Number of Addenda: 0 Note Initiated On: 02/04/2020 9:30 AM Total Procedure Duration:  0 hours 1 minute 58 seconds  Estimated Blood Loss:  Estimated blood loss: none.      [facility]

## 2020-02-04 NOTE — Anesthesia Preprocedure Evaluation (Signed)
Anesthesia Evaluation  Patient identified by MRN, date of birth, ID band Patient awake    Reviewed: Allergy & Precautions, H&P , NPO status , Patient's Chart, lab work & pertinent test results  Airway Mallampati: II  TM Distance: >3 FB Neck ROM: full    Dental no notable dental hx.    Pulmonary    Pulmonary exam normal breath sounds clear to auscultation       Cardiovascular Normal cardiovascular exam Rhythm:regular Rate:Normal     Neuro/Psych    GI/Hepatic GERD  ,  Endo/Other    Renal/GU      Musculoskeletal   Abdominal   Peds  Hematology   Anesthesia Other Findings   Reproductive/Obstetrics                             Anesthesia Physical Anesthesia Plan  ASA: II  Anesthesia Plan: General   Post-op Pain Management:    Induction: Intravenous  PONV Risk Score and Plan: 3 and Treatment may vary due to age or medical condition, TIVA and Propofol infusion  Airway Management Planned: Natural Airway  Additional Equipment:   Intra-op Plan:   Post-operative Plan:   Informed Consent: I have reviewed the patients History and Physical, chart, labs and discussed the procedure including the risks, benefits and alternatives for the proposed anesthesia with the patient or authorized representative who has indicated his/her understanding and acceptance.     Dental Advisory Given  Plan Discussed with: CRNA  Anesthesia Plan Comments:         Anesthesia Quick Evaluation  

## 2020-02-05 ENCOUNTER — Encounter: Payer: Self-pay | Admitting: Gastroenterology

## 2020-02-05 LAB — SURGICAL PATHOLOGY

## 2020-02-06 ENCOUNTER — Encounter: Payer: Self-pay | Admitting: Gastroenterology

## 2020-02-12 ENCOUNTER — Encounter: Payer: Self-pay | Admitting: Gastroenterology

## 2020-10-12 ENCOUNTER — Ambulatory Visit: Payer: 59 | Admitting: Family Medicine

## 2020-10-12 ENCOUNTER — Encounter: Payer: Self-pay | Admitting: Family Medicine

## 2020-10-12 ENCOUNTER — Other Ambulatory Visit: Payer: Self-pay

## 2020-10-12 VITALS — BP 121/61 | HR 89 | Temp 99.1°F | Resp 16 | Wt 170.0 lb

## 2020-10-12 DIAGNOSIS — S161XXA Strain of muscle, fascia and tendon at neck level, initial encounter: Secondary | ICD-10-CM | POA: Diagnosis not present

## 2020-10-12 NOTE — Progress Notes (Signed)
Established patient visit   Patient: Betty Brennan   DOB: 01-Mar-1990   30 y.o. Female  MRN: [id number] Visit Date: 10/12/2020  Today's healthcare provider: Megan Mans, MD   Chief Complaint  Patient presents with   Neck Pain   Subjective    Neck Pain  This is a chronic problem. The current episode started more than 1 year ago. The problem occurs constantly. The problem has been gradually worsening. The pain is associated with nothing. The pain is present in the left side. The quality of the pain is described as aching. The pain is moderate. The symptoms are aggravated by twisting. The pain is Same all the time. Pertinent negatives include no fever, headaches, numbness or trouble swallowing.   Patient states that discomfort started in January 2021.  No known trauma.  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  He has more discomfort in the left anterior neck at the end of the day and works at the end of the week.  It is better when Betty Brennan is able to relax and better after walks.  No pain down her arms.    Medications: Outpatient Medications Prior to Visit  Medication Sig   amoxicillin-clavulanate (AUGMENTIN) 875-125 MG tablet Take 1 tablet by mouth 2 (two) times daily. (Patient not taking: Reported on 01/25/2020)   azelastine (ASTELIN) 0.1 % nasal spray Place 2 sprays into both nostrils 2 (two) times daily. Use in each nostril as directed (Patient not taking: No sig reported)   cetirizine (ZYRTEC) 10 MG tablet Take 1 tablet (10 mg total) by mouth daily. (Patient not taking: Reported on 01/25/2020)   cyclobenzaprine (FLEXERIL) 10 MG tablet cyclobenzaprine 10 mg tablet  TAKE 1 TABLET BY MOUTH EVERYDAY AT BEDTIME (Patient not taking: No sig reported)   Dexlansoprazole (DEXILANT) 30 MG capsule Take 30 mg by mouth daily.   fluticasone (FLONASE) 50 MCG/ACT nasal spray Place 2 sprays into both nostrils daily.   meloxicam (MOBIC) 15 MG tablet Take 15 mg by mouth daily. (Patient not taking:  No sig reported)   pantoprazole (PROTONIX) 40 MG tablet TAKE 1 TABLET BY MOUTH EVERY DAY (Patient not taking: Reported on 01/25/2020)   No facility-administered medications prior to visit.    Review of Systems  Constitutional:  Negative for fever.  HENT:  Negative for trouble swallowing.   Musculoskeletal:  Positive for neck pain.  Neurological:  Negative for numbness and headaches.       Objective    BP 121/61   Pulse 89   Temp 99.1 F (37.3 C)   Resp 16   Wt 170 lb (77.1 kg)   BMI 31.09 kg/m     Physical Exam Vitals reviewed.  Constitutional:      General: Betty Brennan is not in acute distress.    Appearance: Normal appearance. Betty Brennan is well-developed. Betty Brennan is not ill-appearing, toxic-appearing or diaphoretic.  HENT:     Head: Normocephalic and atraumatic.     Right Ear: Hearing, ear canal and external ear normal. A middle ear effusion is present. No mastoid tenderness. Tympanic membrane is erythematous and bulging. Tympanic membrane is not perforated.     Left Ear: Hearing, ear canal and external ear normal.  No middle ear effusion. No mastoid tenderness. Tympanic membrane is not perforated, erythematous or bulging.     Nose: Mucosal edema present.     Right Sinus: No maxillary sinus tenderness or frontal sinus tenderness.     Left Sinus: No maxillary sinus  tenderness or frontal sinus tenderness.     Mouth/Throat:     Lips: Pink.     Mouth: Mucous membranes are moist. No oral lesions.     Pharynx: Oropharynx is clear. Uvula midline. No oropharyngeal exudate or posterior oropharyngeal erythema.     Tonsils: No tonsillar exudate or tonsillar abscesses. 2+ on the right. 0 on the left.     Comments:  Cobblestoning posterior pharynx; bilateral allergic shiners;bilateral nasal turbinates mild edema erythema clear discharge;    Eyes:     General: Lids are normal. Allergic shiner present. No scleral icterus.       Right eye: No discharge.        Left eye: No discharge.      Conjunctiva/sclera: Conjunctivae normal.     Pupils: Pupils are equal, round, and reactive to light.  Neck:     Vascular: No JVD.     Trachea: Trachea normal. No tracheal deviation.     Meningeal: Brudzinski's sign absent.     Comments: Negative Spurling's test. Cardiovascular:     Rate and Rhythm: Normal rate and regular rhythm.     Heart sounds: Normal heart sounds. No murmur heard.   No friction rub. No gallop.  Pulmonary:     Effort: Pulmonary effort is normal. No respiratory distress.     Breath sounds: Normal breath sounds. No stridor. No wheezing or rales.  Chest:     Chest wall: No tenderness.  Abdominal:     General: Bowel sounds are normal.     Palpations: Abdomen is soft.  Musculoskeletal:     Cervical back: Full passive range of motion without pain, normal range of motion and neck supple.  Lymphadenopathy:     Head:     Right side of head: No submental, submandibular, tonsillar, preauricular, posterior auricular or occipital adenopathy.     Left side of head: No submental, submandibular, tonsillar, preauricular, posterior auricular or occipital adenopathy.     Cervical: No cervical adenopathy.     Right cervical: No superficial, deep or posterior cervical adenopathy.    Left cervical: No superficial, deep or posterior cervical adenopathy.  Skin:    General: Skin is warm and dry.     Capillary Refill: Capillary refill takes less than 2 seconds.     Coloration: Skin is not pale.     Findings: No erythema, lesion or rash.  Neurological:     General: No focal deficit present.     Mental Status: Betty Brennan is alert and oriented to person, place, and time.  Psychiatric:        Mood and Affect: Mood normal.        Speech: Speech normal.        Behavior: Behavior normal.        Thought Content: Thought content normal.        Judgment: Judgment normal.      No results found for any visits on 10/12/20.  Assessment & Plan      1. 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  Talked about getting a standing desk and working on positioning with her computer. Flexeril 5 mg nightly and work on above.   No follow-ups on file.      I, Megan Mans, MD, have reviewed all documentation for this visit. The documentation on 10/15/20 for the exam, diagnosis, procedures, and orders are all accurate and complete.    Kishon Garriga  Cranford Mon, MD  [facility] [phone] (phone) [phone] (fax)  [place]

## 2021-07-19 ENCOUNTER — Ambulatory Visit: Payer: Self-pay

## 2021-07-19 ENCOUNTER — Ambulatory Visit: Payer: 59 | Admitting: Family Medicine

## 2021-07-19 ENCOUNTER — Encounter: Payer: Self-pay | Admitting: Family Medicine

## 2021-07-19 VITALS — BP 133/81 | HR 97 | Temp 98.7°F | Resp 16 | Ht 62.0 in | Wt 183.9 lb

## 2021-07-19 DIAGNOSIS — K219 Gastro-esophageal reflux disease without esophagitis: Secondary | ICD-10-CM | POA: Diagnosis not present

## 2021-07-19 DIAGNOSIS — R55 Syncope and collapse: Secondary | ICD-10-CM | POA: Insufficient documentation

## 2021-07-19 DIAGNOSIS — K2100 Gastro-esophageal reflux disease with esophagitis, without bleeding: Secondary | ICD-10-CM

## 2021-07-19 DIAGNOSIS — Z23 Encounter for immunization: Secondary | ICD-10-CM | POA: Diagnosis not present

## 2021-07-19 MED ORDER — PANTOPRAZOLE SODIUM 40 MG PO TBEC: 40.0000 mg | DELAYED_RELEASE_TABLET | Freq: Every day | ORAL | 0 refills | Status: AC

## 2021-07-19 NOTE — Progress Notes (Signed)
   SUBJECTIVE:   CHIEF COMPLAINT / HPI:   Fall - was packing up car, tripped over shoe and fell on Friday. Landed on everted L ankle and R knee. Subsequently fainted and vomited. Witnessed by family members. Did not have jerking, shaking movements, post-episode confusion. Was out for about 10 seconds.  - has h/o vasovagal episodes after injuries or emotional stimulus, occasionally with vomiting. - has noticed some L ankle swelling since with pain with weight bearing. No bruising. Was able to bear weight immediately after fall. Is ambulating with crutches. Not using ice, compression. Occasional ibuprofen.  - has had some coughing and chest heaviness since fall, is concerned she may have pneumonia but also not taking PPI and has eaten fast food since fall which normally exacerbates her GERD. - denies fever, SOB, pain with deep breathing, chest pain.  OBJECTIVE:   BP 133/81 (BP Location: Right Arm, Patient Position: Sitting, Cuff Size: Normal)   Pulse 97   Temp 98.7 F (37.1 C) (Oral)   Resp 16   Ht 5\' 2"  (1.575 m)   Wt 183 lb 14.4 oz (83.4 kg)   BMI 33.64 kg/m   Gen: well appearing, in NAD Card: RRR Lungs: CTAB. No rales, wheezes.  MSK: mild swelling noted to lateral L ankle, no bruising. NonTTP over posterior lateral malleolus and base of 5th metatarsal. Full AROM in dorsiflexion, plantarflexion, eversion, inversion, lateral and medial deviation. Intact DP pulses.  Ext: WWP, no edema   ASSESSMENT/PLAN:   Vasovagal episode With prodromal symptoms, inciting event, and known h/o vasovagal episodes, likely vasovagal episode. Not consistent with seizure. No h/o flexible joints,   Gastroesophageal reflux disease with esophagitis without hemorrhage Likely contributing to cough given hasn't taken PPI or H2 blocker and recent increase in fast food consumption. Less likely aspiration pneumonia, lungs clear on exam, appropriately saturated, and without other respiratory symptoms. PPI refilled  today. Encouraged to avoid known triggers.   L ankle sprain Recommend continued supportive measures, rest, ice, compression, elevation. No signs of fracture on exam. May bear weight as tolerated.   , DO

## 2021-07-19 NOTE — Assessment & Plan Note (Addendum)
Likely contributing to cough given hasn't taken PPI or H2 blocker and recent increase in fast food consumption. Less likely aspiration pneumonia, lungs clear on exam, appropriately saturated, and without other respiratory symptoms. PPI refilled today. Encouraged to avoid known triggers.

## 2021-07-19 NOTE — Assessment & Plan Note (Signed)
With prodromal symptoms, inciting event, and known h/o vasovagal episodes, likely vasovagal episode. Not consistent with seizure. No h/o flexible joints,

## 2021-07-19 NOTE — Telephone Encounter (Signed)
  Chief Complaint: fall Symptoms: fall tripped over shoe. L ankle injury, both leg swelling, passed out and vomited and choked on vomit  Frequency: occurred Friday Pertinent Negatives: NA Disposition: [] ED /[] Urgent Care (no appt availability in office) / [x] Appointment(In office/virtual)/ []  [facility]/ [] Home Care/ [] Refused Recommended Disposition /[] [facility]/ []  Follow-up with PCP Additional Notes: [place] when she tripped over shoe and fell, passed out and then vomited and possibly choked on vomit. She said she normally passes out when she gets injured. Her parents were with her and denied any jerking movements. Pt said she hurt L ankle but has swelling in both legs that started yesterday. She is using a crutch to help with L ankle. Has slight cough as well. Scheduled pt for OV today at 1300 with Dr. .   Reason for Disposition  MILD weakness (i.e., does not interfere with ability to work, go to school, normal activities) (Exception: mild weakness is a chronic symptom)  Answer Assessment - Initial Assessment Questions 1. MECHANISM: "How did the fall happen?"     Tripped over shoe  3. ONSET: "When did the fall happen?" (e.g., minutes, hours, or days ago)     Friday 4. LOCATION: "What part of the body hit the ground?" (e.g., back, buttocks, head, hips, knees, hands, head, stomach)     L ankle  5. INJURY: "Did you hurt (injure) yourself when you fell?" If Yes, ask: "What did you injure? Tell me more about this?" (e.g., body area; type of injury; pain severity)"     Ankle  6. PAIN: "Is there any pain?" If Yes, ask: "How bad is the pain?" (e.g., Scale 1-10; or mild,  moderate, severe)   - NONE (0): No pain   - MILD (1-3): Doesn't interfere with normal activities    - MODERATE (4-7): Interferes with normal activities or awakens from sleep    - SEVERE (8-10): Excruciating pain, unable to do any normal activities       1  9. OTHER SYMPTOMS: "Do you have any other symptoms?" (e.g., dizziness, fever, weakness; new onset or worsening).      Swelling in both legs, passed out Friday and got sick  Protocols used: Falls and [facility]

## 2022-01-17 IMAGING — CR DG LUMBAR SPINE COMPLETE 4+V
1 series · 5 of 5 positions shown · non-contrast
Comparison: None

CLINICAL DATA: Back pain, muscle spasm suspected, lower back pain
since last [REDACTED]

EXAM:
LUMBAR SPINE - COMPLETE 4+ VIEW

[Series 1: dg lumbar spine complete 4 +v · 0.14mm/px · 5 of 5 slices shown]
[im 1/5]
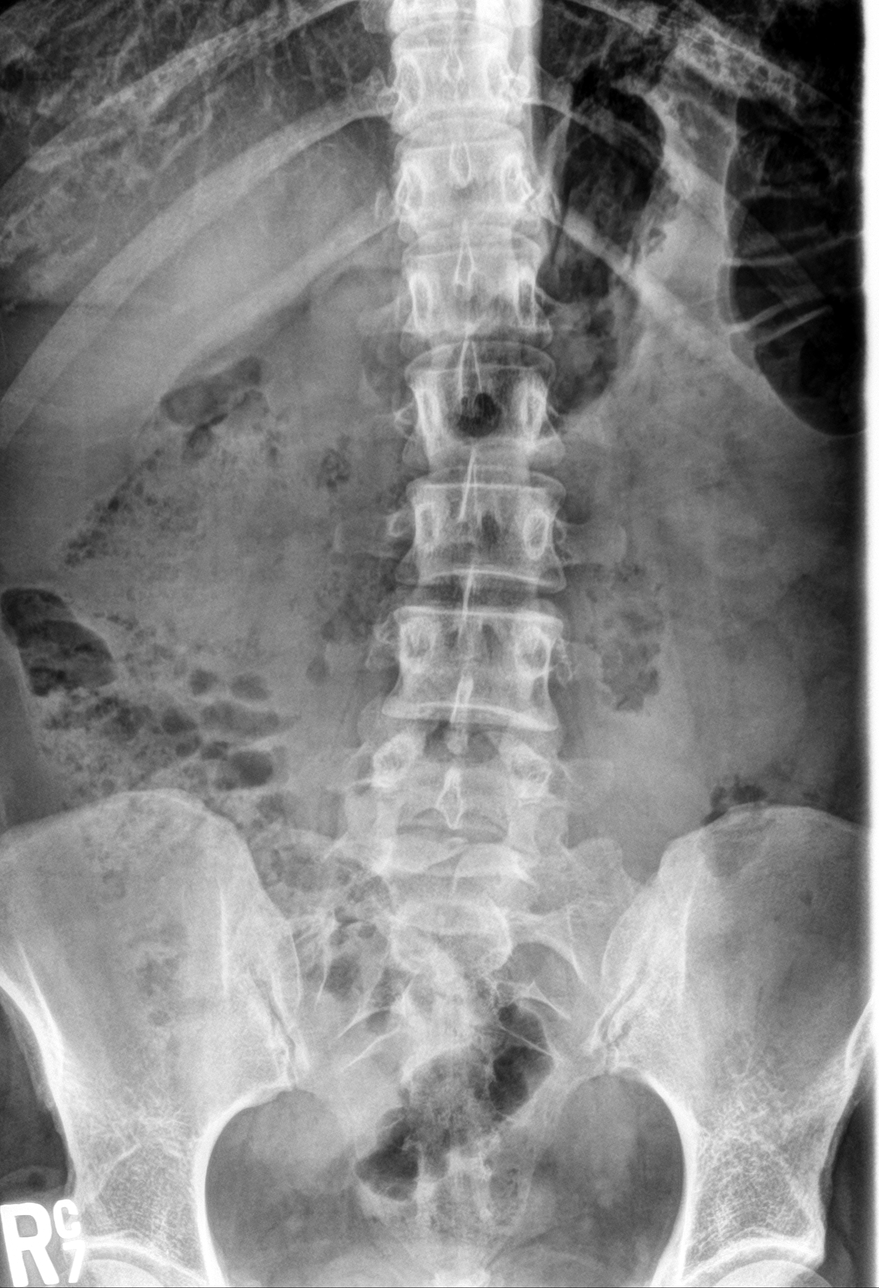
[im 2/5]
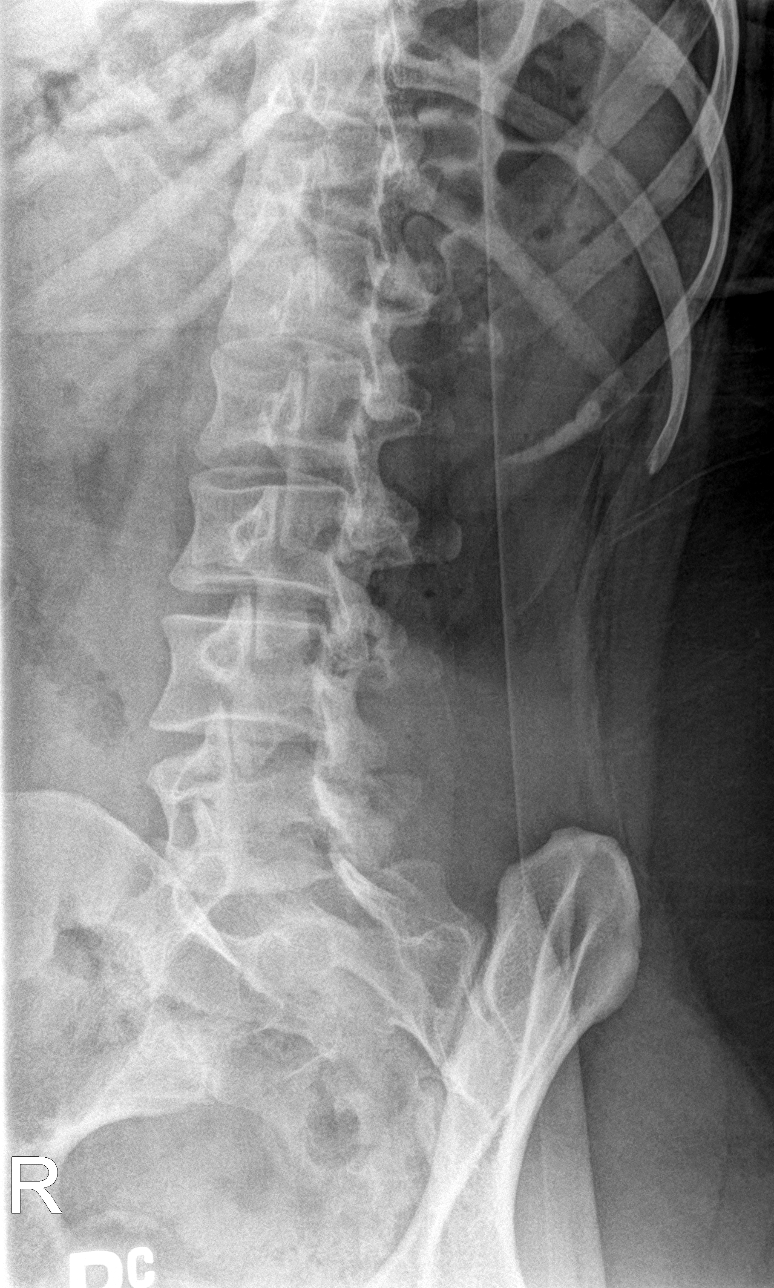
[im 3/5]
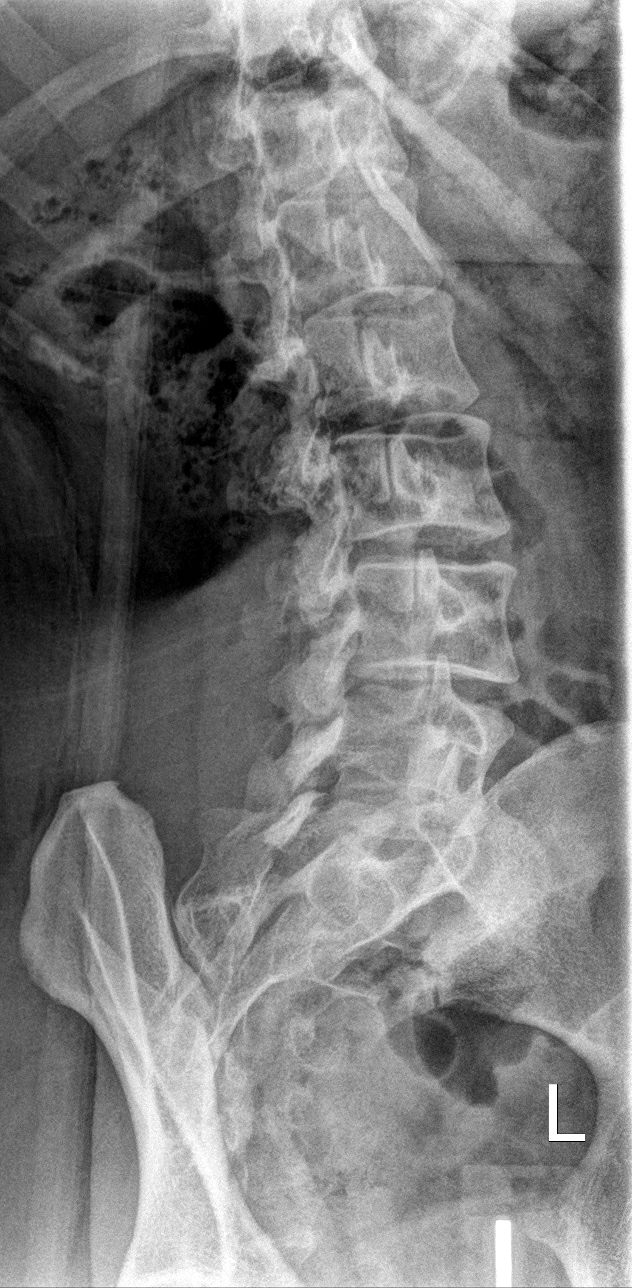
[im 4/5]
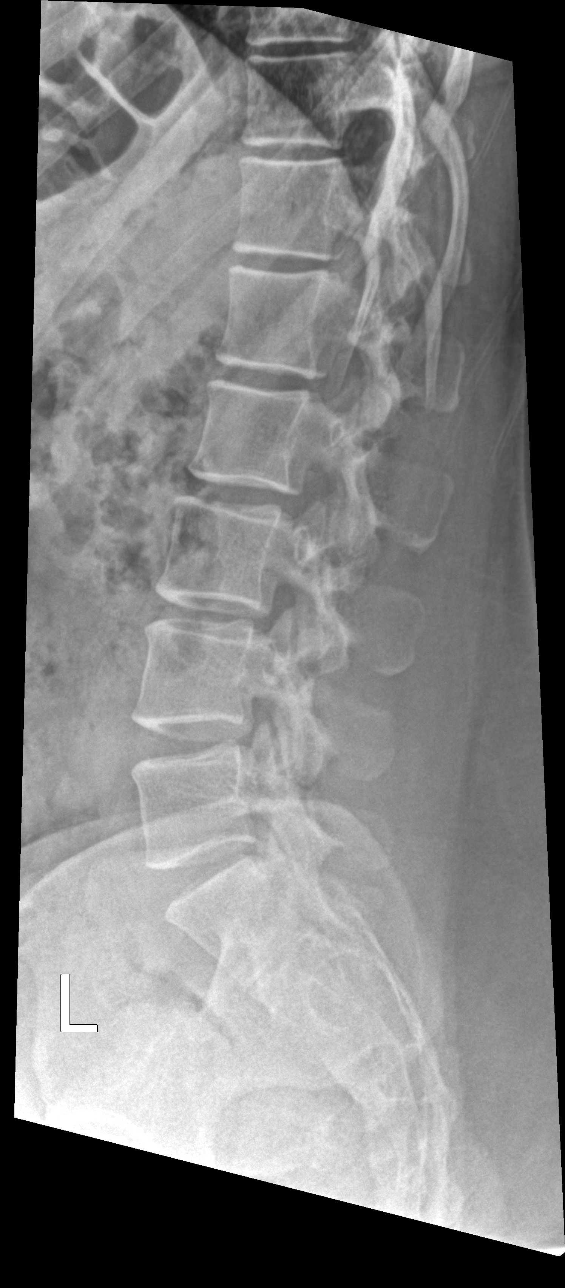
[im 5/5]
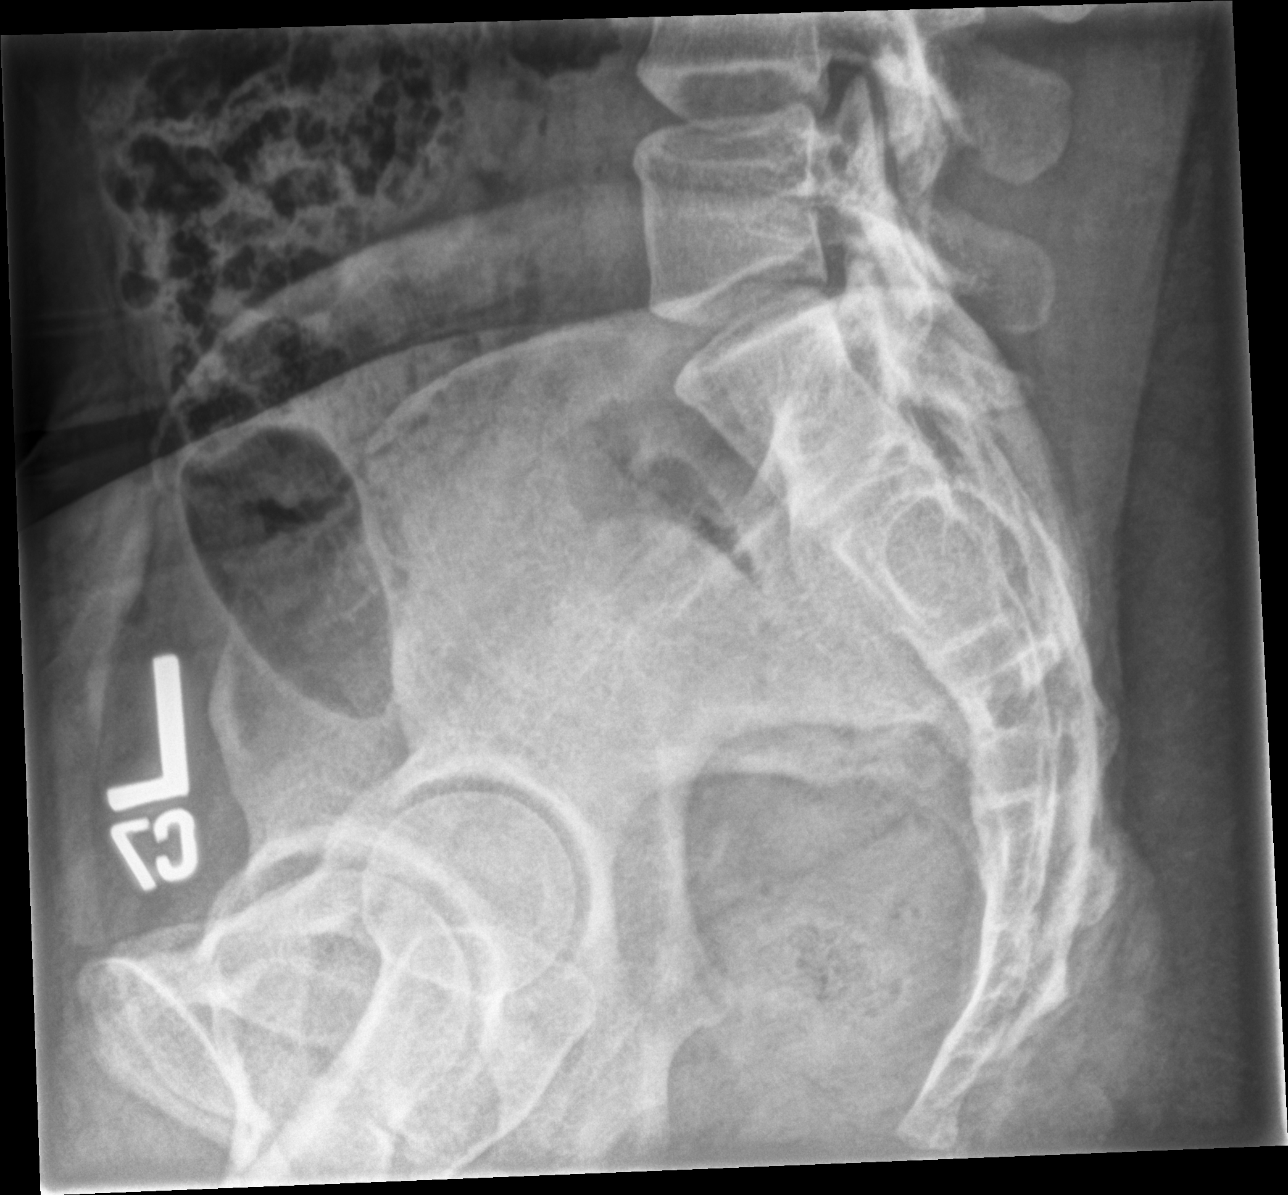

[5 of 5 positions shown; findings below may reference images not displayed]

FINDINGS: Mild levo convexity of the lumbar spine, may be positional centered
about L3.

Mild retrolisthesis is noted at L2-3. Approximately 2-3 mm.

Mild to moderate disc space narrowing at L5-S1.
IMPRESSION: Mild retrolisthesis of L2 on L3. Mild to moderate disc space
narrowing at L5-S1.

## 2022-04-07 IMAGING — CR DG CERVICAL SPINE COMPLETE 4+V
1 series · 7 of 7 positions shown · non-contrast
Comparison: None.

CLINICAL DATA: Left-sided neck pain for 3 months.  No known injury.

EXAM:
CERVICAL SPINE - COMPLETE 4+ VIEW

[Series 1: dg cervical spine complete · 0.14mm/px · 7 of 7 slices shown]
[im 1/7]
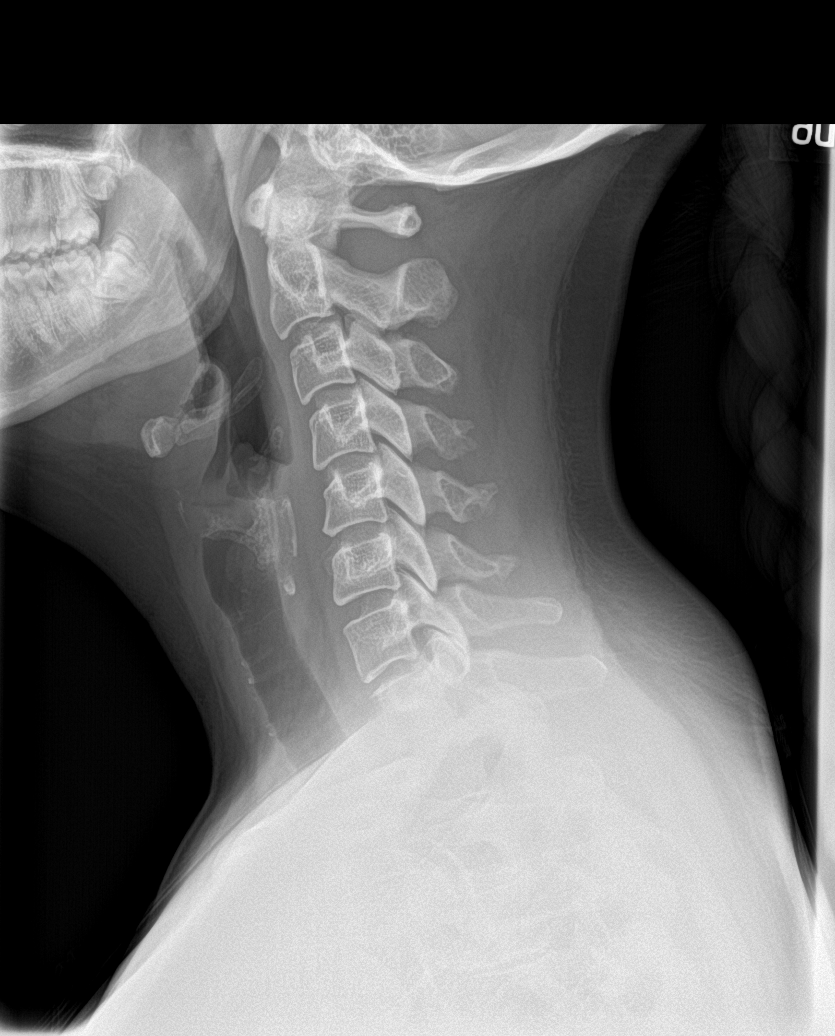
[im 2/7]
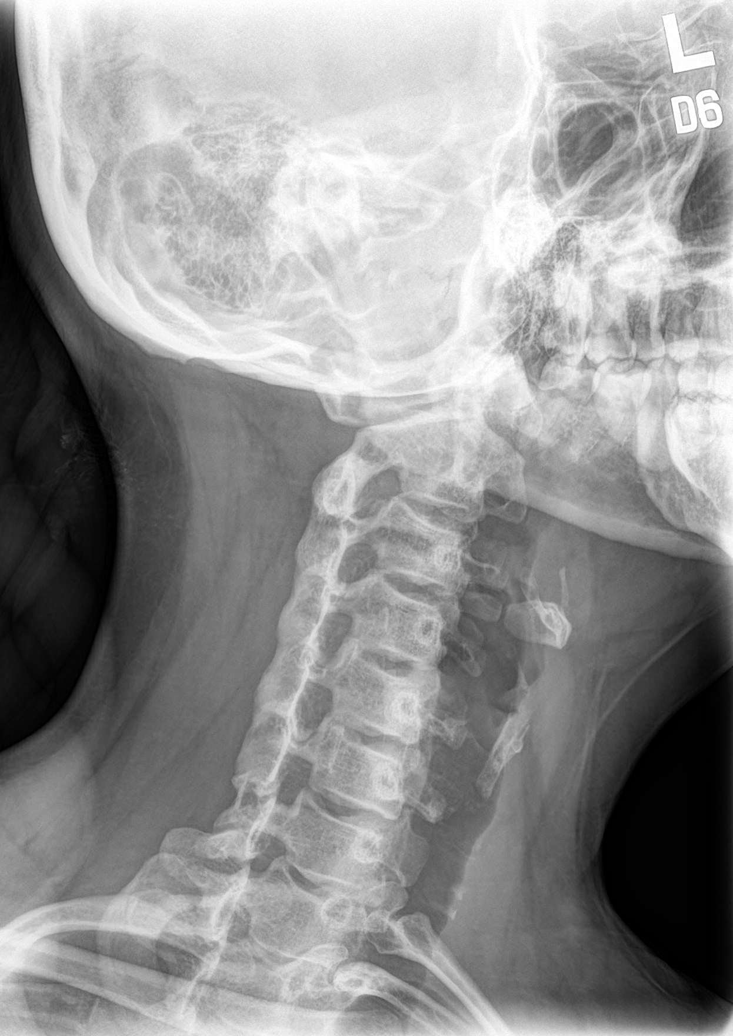
[im 3/7]
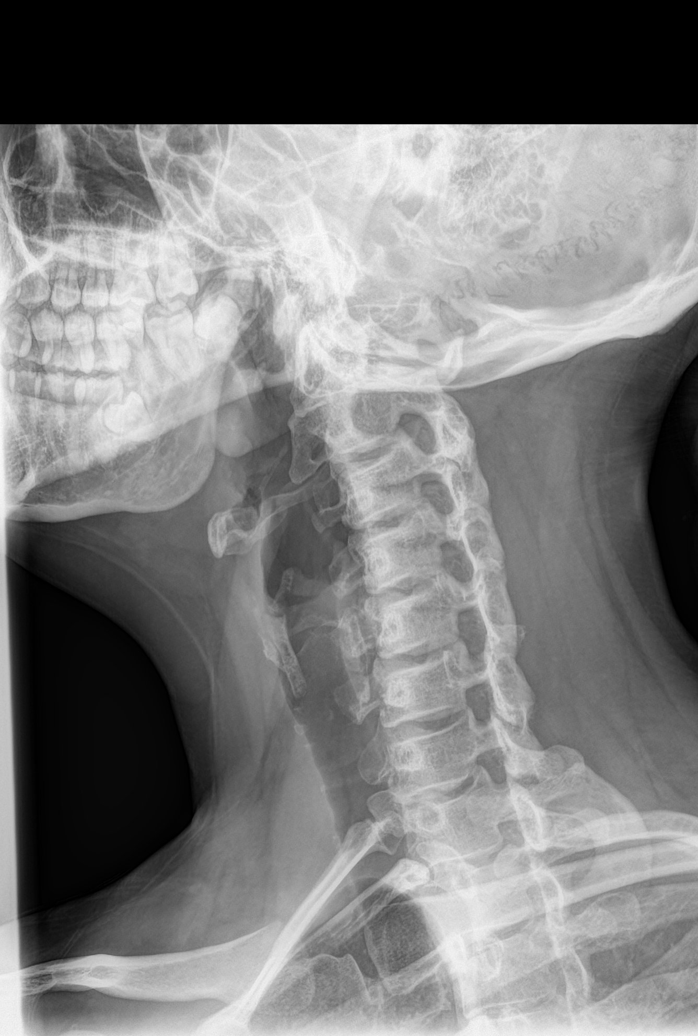
[im 4/7]
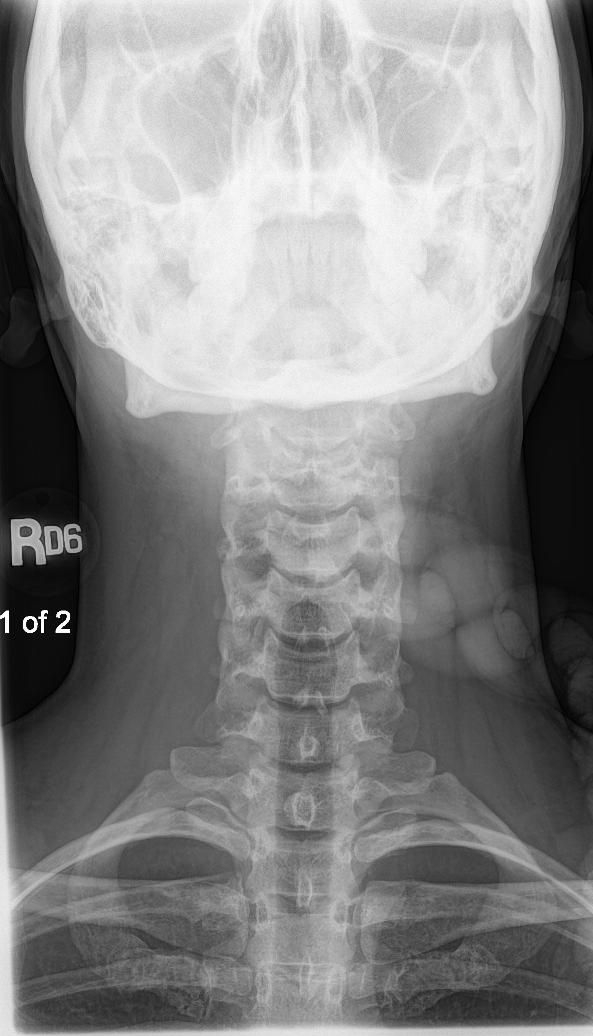
[im 5/7]
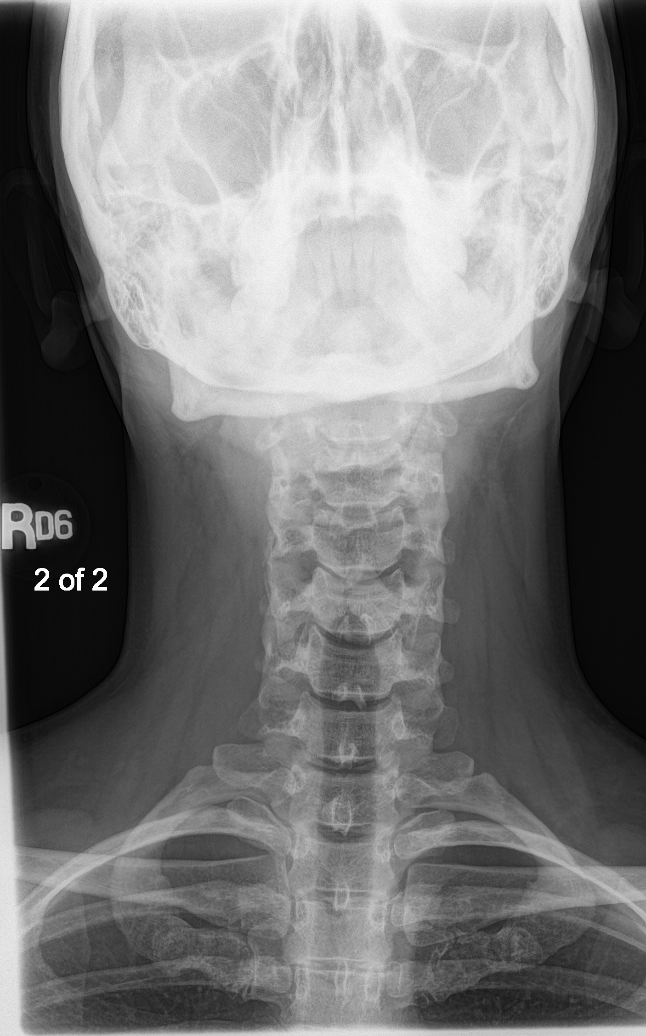
[im 6/7]
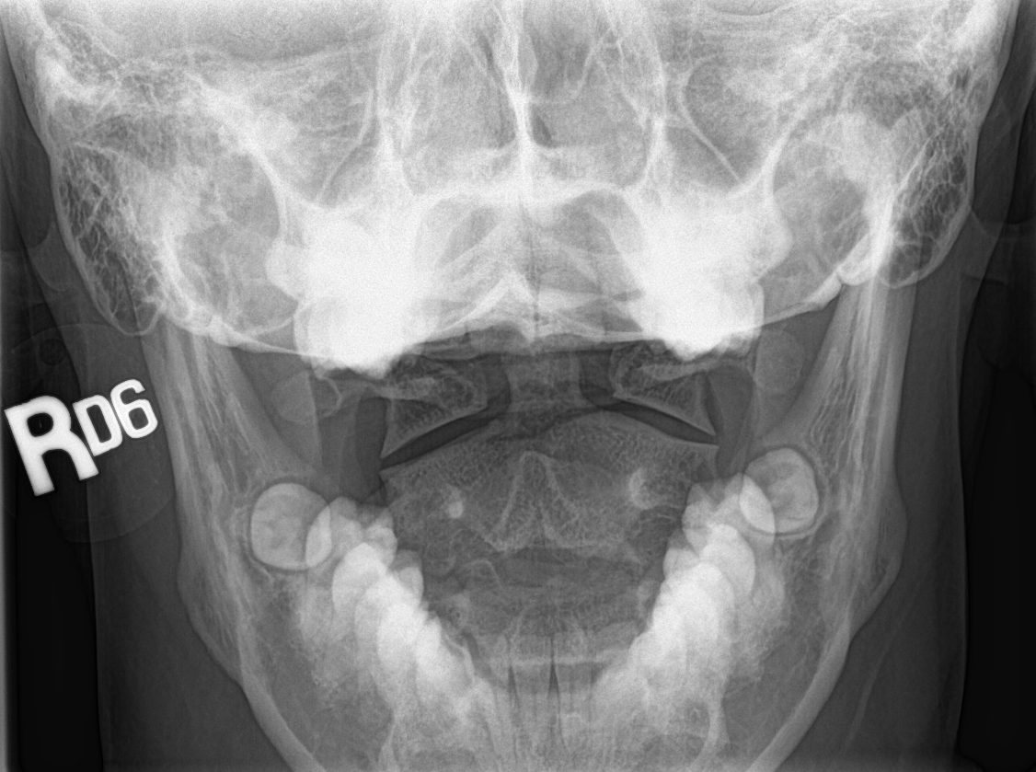
[im 7/7]
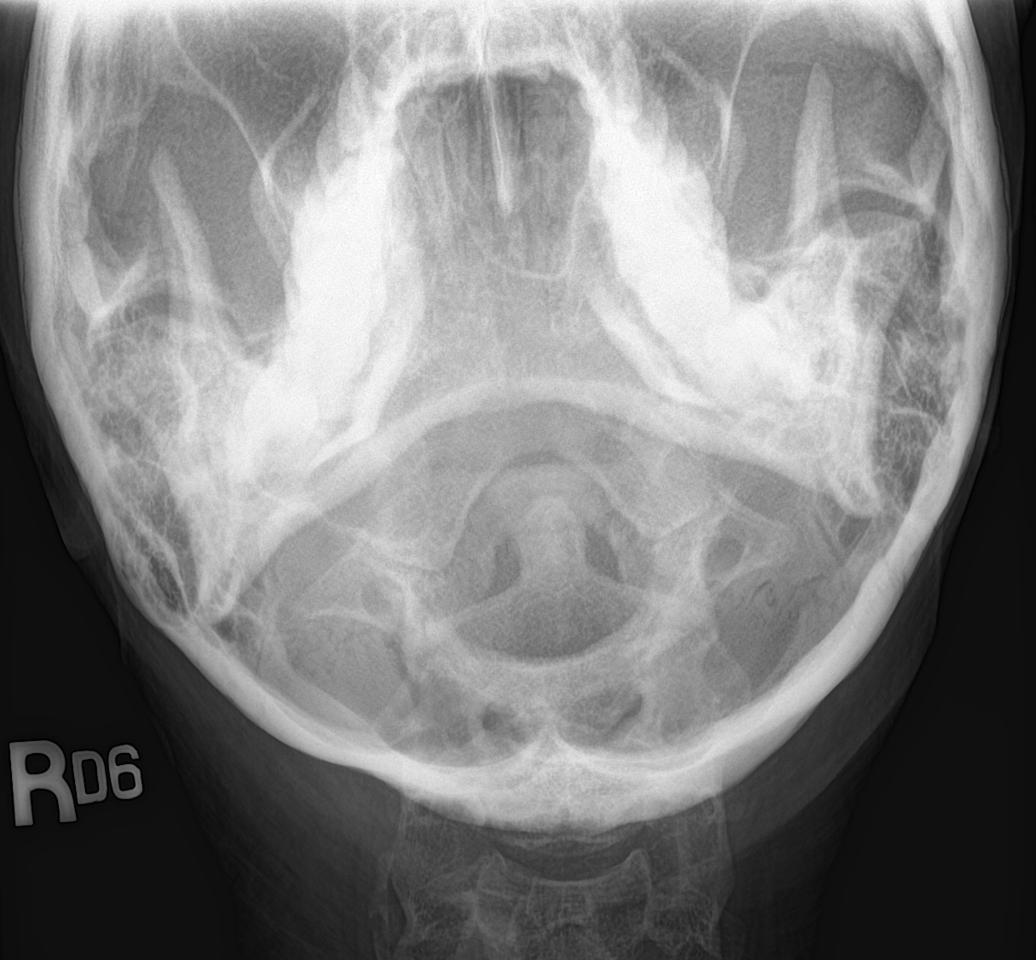

[7 of 7 positions shown; findings below may reference images not displayed]

FINDINGS: There is no evidence of cervical spine fracture or prevertebral soft
tissue swelling. Alignment is normal. No other significant bone
abnormalities are identified.
IMPRESSION: Negative cervical spine radiographs.
